# Patient Record
Sex: Male | Born: 1965 | Race: White | Hispanic: No | Marital: Married | Smoking: Former smoker
Health system: Southern US, Community
[De-identification: ages and names within clinical notes are randomized; demographics above are authoritative.]

## PROBLEM LIST (undated history)

## (undated) DIAGNOSIS — F329 Major depressive disorder, single episode, unspecified: Secondary | ICD-10-CM

## (undated) DIAGNOSIS — Z8719 Personal history of other diseases of the digestive system: Secondary | ICD-10-CM

## (undated) DIAGNOSIS — K5190 Ulcerative colitis, unspecified, without complications: Secondary | ICD-10-CM

## (undated) DIAGNOSIS — F419 Anxiety disorder, unspecified: Secondary | ICD-10-CM

## (undated) DIAGNOSIS — K5903 Drug induced constipation: Secondary | ICD-10-CM

## (undated) DIAGNOSIS — F32A Depression, unspecified: Secondary | ICD-10-CM

## (undated) DIAGNOSIS — Z9889 Other specified postprocedural states: Secondary | ICD-10-CM

## (undated) DIAGNOSIS — Z8669 Personal history of other diseases of the nervous system and sense organs: Secondary | ICD-10-CM

## (undated) DIAGNOSIS — G8929 Other chronic pain: Secondary | ICD-10-CM

## (undated) DIAGNOSIS — K219 Gastro-esophageal reflux disease without esophagitis: Secondary | ICD-10-CM

## (undated) DIAGNOSIS — R112 Nausea with vomiting, unspecified: Secondary | ICD-10-CM

## (undated) DIAGNOSIS — G43909 Migraine, unspecified, not intractable, without status migrainosus: Secondary | ICD-10-CM

## (undated) DIAGNOSIS — G4700 Insomnia, unspecified: Secondary | ICD-10-CM

## (undated) DIAGNOSIS — R42 Dizziness and giddiness: Secondary | ICD-10-CM

## (undated) DIAGNOSIS — R002 Palpitations: Secondary | ICD-10-CM

## (undated) DIAGNOSIS — K529 Noninfective gastroenteritis and colitis, unspecified: Secondary | ICD-10-CM

## (undated) DIAGNOSIS — J189 Pneumonia, unspecified organism: Secondary | ICD-10-CM

## (undated) DIAGNOSIS — Z8709 Personal history of other diseases of the respiratory system: Secondary | ICD-10-CM

## (undated) DIAGNOSIS — R309 Painful micturition, unspecified: Secondary | ICD-10-CM

## (undated) DIAGNOSIS — M549 Dorsalgia, unspecified: Secondary | ICD-10-CM

## (undated) DIAGNOSIS — J329 Chronic sinusitis, unspecified: Secondary | ICD-10-CM

## (undated) DIAGNOSIS — E785 Hyperlipidemia, unspecified: Secondary | ICD-10-CM

## (undated) DIAGNOSIS — J40 Bronchitis, not specified as acute or chronic: Secondary | ICD-10-CM

## (undated) HISTORY — PX: UMBILICAL HERNIA REPAIR: SHX196

## (undated) HISTORY — PX: FOREARM SURGERY: SHX651

## (undated) HISTORY — PX: OTHER SURGICAL HISTORY: SHX169

## (undated) HISTORY — PX: HERNIA REPAIR: SHX51

## (undated) HISTORY — PX: HAND SURGERY: SHX662

## (undated) HISTORY — PX: NASAL SINUS SURGERY: SHX719

## (undated) HISTORY — PX: KNEE SURGERY: SHX244

---

## 2004-05-18 ENCOUNTER — Emergency Department (HOSPITAL_COMMUNITY): Admission: EM | Admit: 2004-05-18 | Discharge: 2004-05-19 | Payer: Self-pay | Admitting: Emergency Medicine

## 2005-03-03 ENCOUNTER — Ambulatory Visit (HOSPITAL_COMMUNITY): Admission: RE | Admit: 2005-03-03 | Discharge: 2005-03-03 | Payer: Self-pay | Admitting: Orthopedic Surgery

## 2005-06-23 ENCOUNTER — Emergency Department (HOSPITAL_COMMUNITY): Admission: EM | Admit: 2005-06-23 | Discharge: 2005-06-23 | Payer: Self-pay | Admitting: Emergency Medicine

## 2006-07-03 ENCOUNTER — Emergency Department (HOSPITAL_COMMUNITY): Admission: EM | Admit: 2006-07-03 | Discharge: 2006-07-03 | Payer: Self-pay | Admitting: Emergency Medicine

## 2006-07-10 ENCOUNTER — Emergency Department (HOSPITAL_COMMUNITY): Admission: EM | Admit: 2006-07-10 | Discharge: 2006-07-11 | Payer: Self-pay | Admitting: Emergency Medicine

## 2007-04-19 ENCOUNTER — Emergency Department (HOSPITAL_COMMUNITY): Admission: EM | Admit: 2007-04-19 | Discharge: 2007-04-19 | Payer: Self-pay | Admitting: Emergency Medicine

## 2007-11-11 ENCOUNTER — Emergency Department (HOSPITAL_COMMUNITY): Admission: EM | Admit: 2007-11-11 | Discharge: 2007-11-11 | Payer: Self-pay | Admitting: Emergency Medicine

## 2008-05-11 ENCOUNTER — Emergency Department (HOSPITAL_COMMUNITY): Admission: EM | Admit: 2008-05-11 | Discharge: 2008-05-11 | Payer: Self-pay | Admitting: Emergency Medicine

## 2008-06-26 ENCOUNTER — Emergency Department (HOSPITAL_COMMUNITY): Admission: EM | Admit: 2008-06-26 | Discharge: 2008-06-27 | Payer: Self-pay | Admitting: Emergency Medicine

## 2008-06-27 ENCOUNTER — Emergency Department (HOSPITAL_COMMUNITY): Admission: EM | Admit: 2008-06-27 | Discharge: 2008-06-28 | Payer: Self-pay | Admitting: Emergency Medicine

## 2010-03-15 ENCOUNTER — Inpatient Hospital Stay (HOSPITAL_COMMUNITY): Admission: EM | Admit: 2010-03-15 | Discharge: 2010-03-18 | Payer: Self-pay | Admitting: Emergency Medicine

## 2010-09-02 LAB — BASIC METABOLIC PANEL
BUN: 9 mg/dL (ref 6–23)
CO2: 25 mEq/L (ref 19–32)
Calcium: 7.8 mg/dL — ABNORMAL LOW (ref 8.4–10.5)
Chloride: 107 mEq/L (ref 96–112)
Creatinine, Ser: 0.82 mg/dL (ref 0.4–1.5)
GFR calc Af Amer: >60 mL/min (ref >60)
GFR calc non Af Amer: >60 mL/min (ref >60)
Glucose, Bld: 103 mg/dL — ABNORMAL HIGH (ref 70–99)
Potassium: 3.3 mEq/L — ABNORMAL LOW (ref 3.5–5.1)
Sodium: 137 mEq/L (ref 135–145)

## 2010-09-02 LAB — CBC
HCT: 40.5 % (ref 39.0–52.0)
HCT: 41.4 % (ref 39.0–52.0)
HCT: 46.2 % (ref 39.0–52.0)
Hemoglobin: 13.8 g/dL (ref 13.0–17.0)
Hemoglobin: 14.1 g/dL (ref 13.0–17.0)
Hemoglobin: 16 g/dL (ref 13.0–17.0)
MCH: 30.6 pg (ref 26.0–34.0)
MCH: 31.1 pg (ref 26.0–34.0)
MCH: 31.2 pg (ref 26.0–34.0)
MCHC: 34 g/dL (ref 30.0–36.0)
MCHC: 34.1 g/dL (ref 30.0–36.0)
MCHC: 34.7 g/dL (ref 30.0–36.0)
MCV: 90 fL (ref 78.0–100.0)
MCV: 90.1 fL (ref 78.0–100.0)
MCV: 91 fL (ref 78.0–100.0)
Platelets: 180 10*3/uL (ref 150–400)
Platelets: 189 10*3/uL (ref 150–400)
Platelets: 189 10*3/uL (ref 150–400)
RBC: 4.5 MIL/uL (ref 4.22–5.81)
RBC: 4.55 MIL/uL (ref 4.22–5.81)
RBC: 5.13 MIL/uL (ref 4.22–5.81)
RDW: 13.2 % (ref 11.5–15.5)
RDW: 13.2 % (ref 11.5–15.5)
RDW: 13.2 % (ref 11.5–15.5)
WBC: 6.9 10*3/uL (ref 4.0–10.5)
WBC: 7.1 10*3/uL (ref 4.0–10.5)
WBC: 8.3 10*3/uL (ref 4.0–10.5)

## 2010-09-02 LAB — COMPREHENSIVE METABOLIC PANEL
ALT: 21 U/L (ref 0–53)
ALT: 30 U/L (ref 0–53)
AST: 17 U/L (ref 0–37)
AST: 19 U/L (ref 0–37)
Albumin: 3.5 g/dL (ref 3.5–5.2)
Albumin: 4.1 g/dL (ref 3.5–5.2)
Alkaline Phosphatase: 42 U/L (ref 39–117)
Alkaline Phosphatase: 46 U/L (ref 39–117)
BUN: 12 mg/dL (ref 6–23)
BUN: 5 mg/dL — ABNORMAL LOW (ref 6–23)
CO2: 27 mEq/L (ref 19–32)
CO2: 29 mEq/L (ref 19–32)
Calcium: 8.3 mg/dL — ABNORMAL LOW (ref 8.4–10.5)
Calcium: 8.9 mg/dL (ref 8.4–10.5)
Chloride: 105 mEq/L (ref 96–112)
Chloride: 106 mEq/L (ref 96–112)
Creatinine, Ser: 0.98 mg/dL (ref 0.4–1.5)
Creatinine, Ser: 1.01 mg/dL (ref 0.4–1.5)
GFR calc Af Amer: >60 mL/min (ref >60)
GFR calc Af Amer: >60 mL/min (ref >60)
GFR calc non Af Amer: >60 mL/min (ref >60)
GFR calc non Af Amer: >60 mL/min (ref >60)
Glucose, Bld: 126 mg/dL — ABNORMAL HIGH (ref 70–99)
Glucose, Bld: 90 mg/dL (ref 70–99)
Potassium: 3.8 mEq/L (ref 3.5–5.1)
Potassium: 3.8 mEq/L (ref 3.5–5.1)
Sodium: 139 mEq/L (ref 135–145)
Sodium: 140 mEq/L (ref 135–145)
Total Bilirubin: 0.8 mg/dL (ref 0.3–1.2)
Total Bilirubin: 0.9 mg/dL (ref 0.3–1.2)
Total Protein: 6.1 g/dL (ref 6.0–8.3)
Total Protein: 7.1 g/dL (ref 6.0–8.3)

## 2010-09-02 LAB — DIFFERENTIAL
Basophils Absolute: 0 10*3/uL (ref 0.0–0.1)
Basophils Absolute: 0 10*3/uL (ref 0.0–0.1)
Basophils Relative: 0 % (ref 0–1)
Eosinophils Absolute: 0.3 10*3/uL (ref 0.0–0.7)
Eosinophils Relative: 3 % (ref 0–5)
Eosinophils Relative: 5 % (ref 0–5)
Lymphocytes Relative: 26 % (ref 12–46)
Lymphocytes Relative: 38 % (ref 12–46)
Lymphs Abs: 2.1 10*3/uL (ref 0.7–4.0)
Lymphs Abs: 2.7 10*3/uL (ref 0.7–4.0)
Monocytes Absolute: 0.5 10*3/uL (ref 0.1–1.0)
Monocytes Absolute: 0.5 10*3/uL (ref 0.1–1.0)
Monocytes Relative: 6 % (ref 3–12)
Monocytes Relative: 7 % (ref 3–12)
Neutro Abs: 3.6 10*3/uL (ref 1.7–7.7)
Neutro Abs: 5.4 10*3/uL (ref 1.7–7.7)
Neutrophils Relative %: 65 % (ref 43–77)

## 2010-09-02 LAB — PROTIME-INR
INR: 0.99 (ref 0.00–1.49)
Prothrombin Time: 13.3 seconds (ref 11.6–15.2)

## 2010-09-02 LAB — PHOSPHORUS: Phosphorus: 3.5 mg/dL (ref 2.3–4.6)

## 2010-09-02 LAB — SAMPLE TO BLOOD BANK

## 2010-09-02 LAB — APTT: aPTT: 29 seconds (ref 24–37)

## 2010-09-02 LAB — C-REACTIVE PROTEIN: CRP: 0.1 mg/dL — ABNORMAL LOW (ref <0.6)

## 2010-10-04 LAB — COMPREHENSIVE METABOLIC PANEL
ALT: 118 U/L — ABNORMAL HIGH (ref 0–53)
AST: 72 U/L — ABNORMAL HIGH (ref 0–37)
Albumin: 3.4 g/dL — ABNORMAL LOW (ref 3.5–5.2)
Calcium: 8.4 mg/dL (ref 8.4–10.5)
Chloride: 101 mEq/L (ref 96–112)
Creatinine, Ser: 1.04 mg/dL (ref 0.4–1.5)
GFR calc Af Amer: >60 mL/min (ref >60)
Sodium: 135 mEq/L (ref 135–145)

## 2010-10-04 LAB — URINE MICROSCOPIC-ADD ON

## 2010-10-04 LAB — DIFFERENTIAL
Eosinophils Relative: 0 % (ref 0–5)
Lymphocytes Relative: 8 % — ABNORMAL LOW (ref 12–46)
Lymphs Abs: 1.1 10*3/uL (ref 0.7–4.0)
Monocytes Absolute: 0.8 10*3/uL (ref 0.1–1.0)

## 2010-10-04 LAB — CBC
MCHC: 34 g/dL (ref 30.0–36.0)
MCV: 87.5 fL (ref 78.0–100.0)
Platelets: 164 10*3/uL (ref 150–400)
WBC: 13.3 10*3/uL — ABNORMAL HIGH (ref 4.0–10.5)

## 2010-10-04 LAB — URINALYSIS, ROUTINE W REFLEX MICROSCOPIC
Glucose, UA: NEGATIVE mg/dL
Ketones, ur: 15 mg/dL — AB
Leukocytes, UA: NEGATIVE
Nitrite: NEGATIVE
Specific Gravity, Urine: 1.025 (ref 1.005–1.030)
pH: 6 (ref 5.0–8.0)

## 2010-10-25 ENCOUNTER — Emergency Department (HOSPITAL_COMMUNITY)
Admission: EM | Admit: 2010-10-25 | Discharge: 2010-10-25 | Disposition: A | Discharge status: Home / Self Care | Payer: Worker's Compensation | Attending: Emergency Medicine | Admitting: Emergency Medicine

## 2010-10-25 DIAGNOSIS — M545 Low back pain, unspecified: Secondary | ICD-10-CM | POA: Insufficient documentation

## 2010-10-25 DIAGNOSIS — M549 Dorsalgia, unspecified: Secondary | ICD-10-CM | POA: Insufficient documentation

## 2010-10-25 DIAGNOSIS — M5380 Other specified dorsopathies, site unspecified: Secondary | ICD-10-CM | POA: Insufficient documentation

## 2011-04-09 ENCOUNTER — Emergency Department (HOSPITAL_COMMUNITY)
Admission: EM | Admit: 2011-04-09 | Discharge: 2011-04-10 | Disposition: A | Discharge status: Home / Self Care | Payer: Worker's Compensation | Attending: Emergency Medicine | Admitting: Emergency Medicine

## 2011-04-09 DIAGNOSIS — R109 Unspecified abdominal pain: Secondary | ICD-10-CM | POA: Insufficient documentation

## 2011-04-09 DIAGNOSIS — Z7982 Long term (current) use of aspirin: Secondary | ICD-10-CM | POA: Insufficient documentation

## 2011-04-09 DIAGNOSIS — T4275XA Adverse effect of unspecified antiepileptic and sedative-hypnotic drugs, initial encounter: Secondary | ICD-10-CM | POA: Insufficient documentation

## 2011-04-09 DIAGNOSIS — G43909 Migraine, unspecified, not intractable, without status migrainosus: Secondary | ICD-10-CM | POA: Insufficient documentation

## 2011-04-09 DIAGNOSIS — Z79899 Other long term (current) drug therapy: Secondary | ICD-10-CM | POA: Insufficient documentation

## 2011-04-10 ENCOUNTER — Emergency Department (HOSPITAL_COMMUNITY): Payer: Worker's Compensation

## 2011-04-10 LAB — CBC
HCT: 44.4 % (ref 39.0–52.0)
MCH: 30 pg (ref 26.0–34.0)
MCHC: 35.1 g/dL (ref 30.0–36.0)
MCV: 85.4 fL (ref 78.0–100.0)
RDW: 12.2 % (ref 11.5–15.5)

## 2011-04-10 LAB — COMPREHENSIVE METABOLIC PANEL
Albumin: 3.8 g/dL (ref 3.5–5.2)
BUN: 23 mg/dL (ref 6–23)
Chloride: 104 mEq/L (ref 96–112)
Creatinine, Ser: 1.07 mg/dL (ref 0.50–1.35)
Total Bilirubin: 0.1 mg/dL — ABNORMAL LOW (ref 0.3–1.2)
Total Protein: 6.9 g/dL (ref 6.0–8.3)

## 2011-04-10 LAB — DIFFERENTIAL
Eosinophils Relative: 4 % (ref 0–5)
Lymphocytes Relative: 41 % (ref 12–46)
Lymphs Abs: 3.1 10*3/uL (ref 0.7–4.0)
Monocytes Absolute: 0.7 10*3/uL (ref 0.1–1.0)
Monocytes Relative: 9 % (ref 3–12)

## 2011-04-28 ENCOUNTER — Emergency Department (HOSPITAL_BASED_OUTPATIENT_CLINIC_OR_DEPARTMENT_OTHER)
Admission: EM | Admit: 2011-04-28 | Discharge: 2011-04-28 | Disposition: A | Discharge status: Home / Self Care | Payer: Worker's Compensation | Attending: Emergency Medicine | Admitting: Emergency Medicine

## 2011-04-28 ENCOUNTER — Encounter: Payer: Self-pay | Admitting: *Deleted

## 2011-04-28 ENCOUNTER — Emergency Department (INDEPENDENT_AMBULATORY_CARE_PROVIDER_SITE_OTHER): Payer: Worker's Compensation

## 2011-04-28 DIAGNOSIS — K5900 Constipation, unspecified: Secondary | ICD-10-CM | POA: Insufficient documentation

## 2011-04-28 DIAGNOSIS — R109 Unspecified abdominal pain: Secondary | ICD-10-CM

## 2011-04-28 DIAGNOSIS — R110 Nausea: Secondary | ICD-10-CM | POA: Insufficient documentation

## 2011-04-28 HISTORY — DX: Ulcerative colitis, unspecified, without complications: K51.90

## 2011-04-28 LAB — URINE MICROSCOPIC-ADD ON

## 2011-04-28 LAB — COMPREHENSIVE METABOLIC PANEL
ALT: 48 U/L (ref 0–53)
Alkaline Phosphatase: 77 U/L (ref 39–117)
CO2: 28 mEq/L (ref 19–32)
Chloride: 104 mEq/L (ref 96–112)
GFR calc Af Amer: >90 mL/min (ref >90)
GFR calc non Af Amer: >90 mL/min (ref >90)
Glucose, Bld: 97 mg/dL (ref 70–99)
Potassium: 4.1 mEq/L (ref 3.5–5.1)
Sodium: 140 mEq/L (ref 135–145)
Total Bilirubin: 0.4 mg/dL (ref 0.3–1.2)

## 2011-04-28 LAB — URINALYSIS, ROUTINE W REFLEX MICROSCOPIC
Hgb urine dipstick: NEGATIVE
Protein, ur: NEGATIVE mg/dL
Urobilinogen, UA: 1 mg/dL (ref 0.0–1.0)

## 2011-04-28 LAB — CBC
MCV: 83.5 fL (ref 78.0–100.0)
Platelets: 175 10*3/uL (ref 150–400)
RBC: 5.2 MIL/uL (ref 4.22–5.81)
WBC: 12.6 10*3/uL — ABNORMAL HIGH (ref 4.0–10.5)

## 2011-04-28 LAB — DIFFERENTIAL
Eosinophils Relative: 1 % (ref 0–5)
Lymphocytes Relative: 11 % — ABNORMAL LOW (ref 12–46)
Lymphs Abs: 1.4 10*3/uL (ref 0.7–4.0)

## 2011-04-28 LAB — OCCULT BLOOD X 1 CARD TO LAB, STOOL: Fecal Occult Bld: POSITIVE

## 2011-04-28 MED ORDER — SODIUM CHLORIDE 0.9 % IV SOLN
Freq: Once | INTRAVENOUS | Status: AC
  Administered 2011-04-28: 20:00:00 via INTRAVENOUS

## 2011-04-28 MED ORDER — POLYETHYLENE GLYCOL 3350 17 G PO PACK: 17.0000 g | PACK | Freq: Every day | ORAL | Status: DC

## 2011-04-28 MED ORDER — DIPHENHYDRAMINE HCL 50 MG/ML IJ SOLN
12.5000 mg | Freq: Once | INTRAMUSCULAR | Status: AC
  Administered 2011-04-28: 12.5 mg via INTRAVENOUS
  Filled 2011-04-28: qty 1

## 2011-04-28 MED ORDER — HYDROMORPHONE HCL PF 1 MG/ML IJ SOLN
1.0000 mg | Freq: Once | INTRAMUSCULAR | Status: AC
  Administered 2011-04-28: 1 mg via INTRAVENOUS
  Filled 2011-04-28: qty 1

## 2011-04-28 MED ORDER — METOCLOPRAMIDE HCL 5 MG/ML IJ SOLN
10.0000 mg | Freq: Once | INTRAMUSCULAR | Status: AC
  Administered 2011-04-28: 10 mg via INTRAVENOUS
  Filled 2011-04-28: qty 2

## 2011-04-28 MED ORDER — SODIUM CHLORIDE 0.9 % IV SOLN
2.0000 mg/h | INTRAVENOUS | Status: DC
  Filled 2011-04-28: qty 5

## 2011-04-28 MED ORDER — CIPROFLOXACIN HCL 500 MG PO TABS: 500.0000 mg | ORAL_TABLET | Freq: Two times a day (BID) | ORAL | Status: AC

## 2011-04-28 MED ORDER — IOHEXOL 300 MG/ML  SOLN
100.0000 mL | Freq: Once | INTRAMUSCULAR | Status: AC | PRN
  Administered 2011-04-28: 100 mL via INTRAVENOUS

## 2011-04-28 MED ORDER — ONDANSETRON HCL 4 MG/2ML IJ SOLN
4.0000 mg | Freq: Once | INTRAMUSCULAR | Status: AC
  Administered 2011-04-28: 4 mg via INTRAVENOUS
  Filled 2011-04-28: qty 2

## 2011-04-28 NOTE — ED Notes (Signed)
Pt to room 6 by ems, ems reports pt takes several narcotics for chronic pain and has been unable to have a bm today, last normal bm last night. ems reports pt sitting on toilet on their arrival c/o cramping and inability to deficate. Per pt request pt given bsc and walks from stretcher to bsc to attempt to deficate. Family at bedside.

## 2011-04-28 NOTE — ED Notes (Signed)
Patient came by EMS, before he was on bed from stretcher, he was demanding to have bowel movement. We moved patient to bedside toilet where his family gathered in and outside room. Patient excused himself from room and made haste to bathroom against staff advise. Patient stayed in hall bath. I was finally able to take initial vitals and then I helped patient into gown and brought warm blankets. Patient resting quietly. Patient was able to pass hard stool in room and stated larger amount of stool passed in bathroom. Patient stated he is taking pain medication.

## 2011-04-28 NOTE — ED Notes (Signed)
Family requesting to speak with Clydie Braun, [place] before discharge, PA made aware of request.

## 2011-04-28 NOTE — ED Provider Notes (Signed)
History     CSN: [id number] Arrival date & time: 04/28/2011  2:05 PM   First MD Initiated Contact with Patient 04/28/11 1417      Chief Complaint  Patient presents with  . Constipation    (Consider location/radiation/quality/duration/timing/severity/associated sxs/prior treatment) Patient is a 45 y.o. male presenting with abdominal pain. The history is provided by the patient. No language interpreter was used.  Abdominal Pain The primary symptoms of the illness include abdominal pain. The current episode started 13 to 24 hours ago. The onset of the illness was sudden. The problem has not changed since onset. Associated with: narcotics. The patient states that she believes she is currently not pregnant. Additional symptoms associated with the illness include chills and constipation.  Pt complains of cramping and nausea.   Pt reports he began feeling very nauseated at home and felt like he was going to pass out.  Pt's wife reports she recently had a diarrhea type illness.  Pt complains of feeling like he needs to have a bowel movement.  Pt reports he passes small hard stool frequently.  Past Medical History  Diagnosis Date  . Colitis, ulcerative   . Chronic pain     History reviewed. No pertinent past surgical history.  History reviewed. No pertinent family history.  History  Substance Use Topics  . Smoking status: Former Games developer  . Smokeless tobacco: Not on file  . Alcohol Use: Yes      Review of Systems  Constitutional: Positive for chills.  Gastrointestinal: Positive for abdominal pain and constipation.  All other systems reviewed and are negative.    Allergies  Nsaids  Home Medications   Current Outpatient Rx  Name Route Sig Dispense Refill  . HYDROCODONE-ACETAMINOPHEN 7.5-400 MG PO TABS Oral Take 1 tablet by mouth every 6 (six) hours as needed.      Marland Kitchen HYDROMORPHONE HCL 12 MG PO TB24 Oral Take by mouth daily.      Marland Kitchen OXYMORPHONE HCL 10 MG PO TABS Oral Take 10 mg  by mouth 2 (two) times daily.      Marland Kitchen PREGABALIN 225 MG PO CAPS Oral Take 225 mg by mouth 2 (two) times daily.        There were no vitals taken for this visit.  Physical Exam  Nursing note and vitals reviewed. Constitutional: He is oriented to person, place, and time. He appears well-developed and well-nourished.  HENT:  Head: Normocephalic and atraumatic.  Right Ear: External ear normal.  Left Ear: External ear normal.  Nose: Nose normal.  Mouth/Throat: Oropharynx is clear and moist.  Eyes: Conjunctivae and EOM are normal. Pupils are equal, round, and reactive to light.  Neck: Normal range of motion.  Cardiovascular: Normal rate and normal heart sounds.   Pulmonary/Chest: Effort normal.  Abdominal: There is tenderness.  Musculoskeletal: Normal range of motion.  Neurological: He is alert and oriented to person, place, and time.  Skin: Skin is warm.  Psychiatric: He has a normal mood and affect.  rectal exam  No stool in vault,     ED Course  Procedures (including critical care time)  Labs Reviewed - No data to display No results found.   No diagnosis found.    MDM  Ct scan shows scarring,  No acute abnormality,   Pt [place] followed by regional physicians.  Pt reports his MD has been trying to change his pain medications.  Pt is currently on opana.   I reviewed ct pt does have a large stool  burden.  I will start miralax.  Pt advised fleets enema, repeat in 1 hour if no relief and again in 6 hours.  Pt advised to see his MD for recheck tomorrow,  Mag citrate        Langston Masker, [place] 04/28/11 1901  Langston Masker, [place] 04/29/11 1950

## 2011-04-28 NOTE — ED Notes (Signed)
IV from right [facility] removed per order, cath intact.

## 2011-04-30 ENCOUNTER — Emergency Department (HOSPITAL_BASED_OUTPATIENT_CLINIC_OR_DEPARTMENT_OTHER)
Admission: EM | Admit: 2011-04-30 | Discharge: 2011-04-30 | Disposition: A | Discharge status: Home / Self Care | Payer: Worker's Compensation | Attending: Emergency Medicine | Admitting: Emergency Medicine

## 2011-04-30 ENCOUNTER — Emergency Department (INDEPENDENT_AMBULATORY_CARE_PROVIDER_SITE_OTHER): Payer: Worker's Compensation

## 2011-04-30 ENCOUNTER — Encounter (HOSPITAL_BASED_OUTPATIENT_CLINIC_OR_DEPARTMENT_OTHER): Payer: Self-pay

## 2011-04-30 DIAGNOSIS — G8929 Other chronic pain: Secondary | ICD-10-CM

## 2011-04-30 DIAGNOSIS — R112 Nausea with vomiting, unspecified: Secondary | ICD-10-CM | POA: Insufficient documentation

## 2011-04-30 DIAGNOSIS — K625 Hemorrhage of anus and rectum: Secondary | ICD-10-CM

## 2011-04-30 DIAGNOSIS — Z79899 Other long term (current) drug therapy: Secondary | ICD-10-CM | POA: Insufficient documentation

## 2011-04-30 DIAGNOSIS — R109 Unspecified abdominal pain: Secondary | ICD-10-CM | POA: Insufficient documentation

## 2011-04-30 HISTORY — DX: Other chronic pain: G89.29

## 2011-04-30 HISTORY — DX: Dorsalgia, unspecified: M54.9

## 2011-04-30 LAB — COMPREHENSIVE METABOLIC PANEL
ALT: 32 U/L (ref 0–53)
Alkaline Phosphatase: 73 U/L (ref 39–117)
BUN: 11 mg/dL (ref 6–23)
CO2: 23 mEq/L (ref 19–32)
Calcium: 10 mg/dL (ref 8.4–10.5)
GFR calc Af Amer: >90 mL/min (ref >90)
GFR calc non Af Amer: >90 mL/min (ref >90)
Glucose, Bld: 122 mg/dL — ABNORMAL HIGH (ref 70–99)
Potassium: 3.5 mEq/L (ref 3.5–5.1)
Sodium: 139 mEq/L (ref 135–145)
Total Protein: 7.4 g/dL (ref 6.0–8.3)

## 2011-04-30 LAB — CBC
Hemoglobin: 15.5 g/dL (ref 13.0–17.0)
MCH: 29.1 pg (ref 26.0–34.0)
MCV: 83.1 fL (ref 78.0–100.0)
Platelets: 198 10*3/uL (ref 150–400)
RBC: 5.32 MIL/uL (ref 4.22–5.81)
WBC: 16.9 10*3/uL — ABNORMAL HIGH (ref 4.0–10.5)

## 2011-04-30 LAB — URINALYSIS, ROUTINE W REFLEX MICROSCOPIC
Ketones, ur: >80 mg/dL — AB
Leukocytes, UA: NEGATIVE
Nitrite: NEGATIVE
Protein, ur: 30 mg/dL — AB
Urobilinogen, UA: 1 mg/dL (ref 0.0–1.0)

## 2011-04-30 LAB — DIFFERENTIAL
Eosinophils Absolute: 0 10*3/uL (ref 0.0–0.7)
Eosinophils Relative: 0 % (ref 0–5)
Lymphocytes Relative: 10 % — ABNORMAL LOW (ref 12–46)
Lymphs Abs: 1.7 10*3/uL (ref 0.7–4.0)
Monocytes Relative: 7 % (ref 3–12)

## 2011-04-30 MED ORDER — ONDANSETRON HCL 4 MG/2ML IJ SOLN: 4.0000 mg | Freq: Once | INTRAMUSCULAR | Status: DC

## 2011-04-30 MED ORDER — ONDANSETRON 8 MG PO TBDP: 8.0000 mg | ORAL_TABLET | Freq: Three times a day (TID) | ORAL | Status: DC | PRN

## 2011-04-30 MED ORDER — FENTANYL CITRATE 0.05 MG/ML IJ SOLN
50.0000 ug | INTRAMUSCULAR | Status: AC
  Administered 2011-04-30: 50 ug via INTRAVENOUS
  Filled 2011-04-30: qty 2

## 2011-04-30 MED ORDER — HYDROMORPHONE HCL PF 1 MG/ML IJ SOLN
1.0000 mg | Freq: Once | INTRAMUSCULAR | Status: AC
  Administered 2011-04-30: 1 mg via INTRAVENOUS

## 2011-04-30 MED ORDER — HYDROMORPHONE HCL PF 2 MG/ML IJ SOLN
2.0000 mg | Freq: Once | INTRAMUSCULAR | Status: DC
  Filled 2011-04-30: qty 1

## 2011-04-30 MED ORDER — ONDANSETRON HCL 4 MG/2ML IJ SOLN
INTRAMUSCULAR | Status: AC
  Administered 2011-04-29: 4 mg via INTRAVENOUS
  Filled 2011-04-30: qty 2

## 2011-04-30 NOTE — ED Provider Notes (Signed)
Medical screening examination/treatment/procedure(s) were performed by non-physician practitioner and as supervising physician I was immediately available for consultation/collaboration.   Glynn Octave, MD 04/30/11 1049

## 2011-04-30 NOTE — ED Notes (Signed)
Pt removed pulse oximetry and blood pressure cuff and ambulated to the restroom with the assistance of his son without difficulty.

## 2011-04-30 NOTE — ED Notes (Signed)
Per [facility] pt was seen here yesterday, c/o abdominal pain and rectal bleeding for past 2 days, iv started per [facility] and 4mg  Zofran given.

## 2011-04-30 NOTE — ED Provider Notes (Signed)
History     CSN: [id number] Arrival date & time: 04/30/2011 12:05 AM   First MD Initiated Contact with Patient 04/30/11 0113      Chief Complaint  Patient presents with  . Abdominal Pain  . Rectal Bleeding    x2 days    (Consider location/radiation/quality/duration/timing/severity/associated sxs/prior treatment) Patient is a 45 y.o. male presenting with abdominal pain and hematochezia. The history is provided by the patient.  Abdominal Pain The primary symptoms of the illness include abdominal pain, nausea, vomiting, diarrhea and hematochezia. The primary symptoms of the illness do not include fever, fatigue, shortness of breath, hematemesis or dysuria. The current episode started more than 2 days ago. The onset of the illness was sudden. The problem has been gradually worsening.  The abdominal pain began more than 2 days ago. The pain came on suddenly. The abdominal pain has been unchanged since its onset. The abdominal pain is generalized. The abdominal pain does not radiate. The severity of the abdominal pain is 10/10. The abdominal pain is relieved by nothing.  Nausea began 2 days ago.  The vomiting began 2 days ago. Vomiting occurs 2 to 5 times per day. The emesis contains stomach contents.  The diarrhea began 2 days ago. The diarrhea is blood-tinged and semi-solid. The diarrhea occurs 2 to 4 times per day (More like loose bowel movement.).  Additional symptoms associated with the illness include back pain. Symptoms associated with the illness do not include hematuria.  Rectal Bleeding  Associated symptoms include abdominal pain, diarrhea, nausea and vomiting. Pertinent negatives include no fever, no hematemesis, no hematuria, no chest pain, no headaches, no coughing and no rash.   Patient seen at this facility on November 8 for the same complaint had CT scan at that time. Patient is followed by pain management. Patient has a history of chronic abdominal pain. Also has a history of  chronic pain to the back. The abdominal pain today has exacerbated his back pain. Occasional vomiting no blood in it. Approximately 2 loose bowel movements per day with some blood streaking.   Past Medical History  Diagnosis Date  . Colitis, ulcerative   . Chronic pain   . Chronic back pain     Past Surgical History  Procedure Date  . Back surgery     History reviewed. No pertinent family history.  History  Substance Use Topics  . Smoking status: Former Games developer  . Smokeless tobacco: Not on file  . Alcohol Use: Yes      Review of Systems  Constitutional: Negative for fever and fatigue.  HENT: Negative for congestion and neck pain.   Eyes: Negative for redness.  Respiratory: Negative for cough and shortness of breath.   Cardiovascular: Negative for chest pain.  Gastrointestinal: Positive for nausea, vomiting, abdominal pain, diarrhea and hematochezia. Negative for hematemesis.  Genitourinary: Negative for dysuria and hematuria.  Musculoskeletal: Positive for back pain.  Skin: Negative for rash.  Neurological: Negative for headaches.  Hematological: Negative for adenopathy.    Allergies  Nsaids and Versed  Home Medications   Current Outpatient Rx  Name Route Sig Dispense Refill  . ZOFRAN IV Intravenous Inject 4 mg into the vein once. Given by [facility] to [facility]      . BUTALBITAL-APAP-CAFFEINE 50-325-40 MG PO TABS Oral Take 1 tablet by mouth 2 (two) times daily as needed. For migraine     . CIPROFLOXACIN HCL 500 MG PO TABS Oral Take 1 tablet (500 mg total) by mouth 2 (two)  times daily. 20 tablet 0  . METHOCARBAMOL 750 MG PO TABS Oral Take 750 mg by mouth 2 (two) times daily as needed. For muscle spasms     . ONDANSETRON 8 MG PO TBDP Oral Take 1 tablet (8 mg total) by mouth every 8 (eight) hours as needed for nausea. 10 tablet 0  . OXYMORPHONE HCL (CRUSH RESIST) 10 MG PO TB12 Oral Take 10 mg by mouth 2 (two) times daily.      Marland Kitchen OXYMORPHONE HCL 5 MG PO TABS Oral Take  5-10 mg by mouth every 4 (four) hours as needed. For breakthrough pain     . POLYETHYLENE GLYCOL 3350 PO PACK Oral Take 17 g by mouth daily. 14 each 0  . PREGABALIN 225 MG PO CAPS Oral Take 225 mg by mouth 2 (two) times daily.      Marland Kitchen RABEPRAZOLE SODIUM 20 MG PO TBEC Oral Take 20 mg by mouth daily as needed. For acid reflux        BP 133/86  Pulse 78  Temp 97.9 F (36.6 C)  Resp 20  Ht 5\' 11"  (1.803 m)  Wt 210 lb (95.255 kg)  BMI 29.29 kg/m2  SpO2 99%  Physical Exam  Nursing note and vitals reviewed. Constitutional: He is oriented to person, place, and time. He appears well-developed and well-nourished.  HENT:  Head: Normocephalic and atraumatic.  Mouth/Throat: Oropharynx is clear and moist.  Eyes: Conjunctivae and EOM are normal. Pupils are equal, round, and reactive to light.  Neck: Normal range of motion. Neck supple.  Cardiovascular: Normal rate, regular rhythm, normal heart sounds and intact distal pulses.   No murmur heard. Pulmonary/Chest: Effort normal and breath sounds normal.  Abdominal: Soft. Bowel sounds are normal. He exhibits no mass. There is no tenderness.  Musculoskeletal: Normal range of motion. He exhibits no edema.       No lumbar or paraspinous tenderness.  Neurological: He is alert and oriented to person, place, and time. No cranial nerve deficit. He exhibits normal muscle tone. Coordination normal.  Skin: Skin is warm and dry. No rash noted. No erythema.    ED Course  Procedures (including critical care time)  Labs Reviewed  URINALYSIS, ROUTINE W REFLEX MICROSCOPIC - Abnormal; Notable for the following:    Color, Urine AMBER (*) BIOCHEMICALS MAY BE AFFECTED BY COLOR   Bilirubin Urine SMALL (*)    Ketones, ur >80 (*)    Protein, ur 30 (*)    All other components within normal limits  URINE MICROSCOPIC-ADD ON - Abnormal; Notable for the following:    Bacteria, UA FEW (*)    All other components within normal limits  CBC - Abnormal; Notable for the  following:    WBC 16.9 (*)    All other components within normal limits  DIFFERENTIAL - Abnormal; Notable for the following:    Neutrophils Relative 83 (*)    Neutro Abs 14.0 (*)    Lymphocytes Relative 10 (*)    Monocytes Absolute 1.2 (*)    All other components within normal limits  COMPREHENSIVE METABOLIC PANEL - Abnormal; Notable for the following:    Glucose, Bld 122 (*)    All other components within normal limits   Ct Abdomen Pelvis W Contrast  04/28/2011  *RADIOLOGY REPORT*  Clinical Data: 45 year old with abdominal pain. History of ulcerative colitis.  CT ABDOMEN AND PELVIS WITH CONTRAST  Technique:  Multidetector CT imaging of the abdomen and pelvis was performed following the standard protocol during bolus  administration of intravenous contrast.  Contrast: OMNIPAQUE IOHEXOL 300 MG/ML IV SOLN  Comparison: 03/15/2010  Findings: The lung bases are clear.  There is no evidence for free air.  Normal appearance of the liver, portal venous system and gallbladder.  Normal appearance of the spleen, pancreas, adrenal glands and kidneys.  Normal appearance of the stomach and duodenum.  There is a stable oval shaped fatty density along the right side of the lower sigmoid colon, probably representing a granuloma and old inflammation.  No gross abnormality to the prostate, seminal vesicles and urinary bladder.  No evidence for free fluid or lymphadenopathy.  No acute bone abnormalities.  IMPRESSION: No acute abdominal or pelvic findings.  There is a stable low density structure along the right side of the distal sigmoid colon.  This finding likely represent old inflammation and granulomatous formation.  No evidence for acute inflammation in this area.  Original Report Authenticated By: Richarda Overlie, M.D.   Dg Abd Acute W/chest  04/30/2011  *RADIOLOGY REPORT*  Clinical Data: Abdominal pain and rectal bleeding; history of ulcerative colitis.  ACUTE ABDOMEN SERIES (ABDOMEN 2 VIEW & CHEST 1 VIEW)   Comparison: CT of the abdomen and pelvis performed 04/28/2011, and chest radiograph performed 06/27/2008  Findings: The lungs are well-aerated and clear.  There is no evidence of focal opacification, pleural effusion or pneumothorax. The cardiomediastinal silhouette is within normal limits.  The visualized bowel gas pattern is unremarkable.  Scattered air and fluid are noted within the colon; there is no evidence of small bowel dilatation to suggest obstruction.  No free intra-abdominal air is identified on the provided upright view.  No acute osseous abnormalities are seen; the sacroiliac joints are unremarkable in appearance.  IMPRESSION:  1.  Unremarkable bowel gas pattern; no free intra-abdominal air seen. 2.  No acute cardiopulmonary process identified.  Original Report Authenticated By: Tonia Ghent, M.D.     1. Chronic abdominal pain       MDM   Patient with history of chronic pain and chronic abdominal pain also history of ulcerative colitis. Patient just seen on November 8 for the same complaint at this facility. Patient had CT scan at that time those results for review and noted as above. ED chart from that visit also reviewed. Patient is followed by pain management specialist. Patient given IM dialogue 2 mg here for the pain. Workup negative except for new elevation in the white blood cell count to 16,000. CT was not repeated since it was just done on the eighth. Acute abdominal series was unremarkable. Suspect of patient's chronic abdominal pain and not in acute exacerbation of ulcerative colitis.        Shelda Jakes, MD 04/30/11 [phone]

## 2011-05-02 ENCOUNTER — Encounter (HOSPITAL_COMMUNITY): Payer: Self-pay | Admitting: *Deleted

## 2011-05-02 ENCOUNTER — Emergency Department (HOSPITAL_COMMUNITY)
Admission: EM | Admit: 2011-05-02 | Discharge: 2011-05-02 | Disposition: A | Discharge status: Home / Self Care | Payer: Worker's Compensation | Attending: Emergency Medicine | Admitting: Emergency Medicine

## 2011-05-02 DIAGNOSIS — Z9889 Other specified postprocedural states: Secondary | ICD-10-CM | POA: Insufficient documentation

## 2011-05-02 DIAGNOSIS — R10816 Epigastric abdominal tenderness: Secondary | ICD-10-CM | POA: Insufficient documentation

## 2011-05-02 DIAGNOSIS — Z79899 Other long term (current) drug therapy: Secondary | ICD-10-CM | POA: Insufficient documentation

## 2011-05-02 DIAGNOSIS — M549 Dorsalgia, unspecified: Secondary | ICD-10-CM | POA: Insufficient documentation

## 2011-05-02 DIAGNOSIS — R1013 Epigastric pain: Secondary | ICD-10-CM | POA: Insufficient documentation

## 2011-05-02 HISTORY — DX: Personal history of other diseases of the respiratory system: Z87.09

## 2011-05-02 HISTORY — DX: Personal history of other diseases of the nervous system and sense organs: Z86.69

## 2011-05-02 HISTORY — DX: Noninfective gastroenteritis and colitis, unspecified: K52.9

## 2011-05-02 HISTORY — DX: Personal history of other diseases of the digestive system: Z87.19

## 2011-05-02 HISTORY — DX: Chronic sinusitis, unspecified: J32.9

## 2011-05-02 LAB — CBC
HCT: 42.2 % (ref 39.0–52.0)
MCHC: 35.5 g/dL (ref 30.0–36.0)
RDW: 12.4 % (ref 11.5–15.5)
WBC: 8.8 10*3/uL (ref 4.0–10.5)

## 2011-05-02 LAB — DIFFERENTIAL
Basophils Absolute: 0 10*3/uL (ref 0.0–0.1)
Basophils Relative: 0 % (ref 0–1)
Lymphocytes Relative: 27 % (ref 12–46)
Neutro Abs: 5.5 10*3/uL (ref 1.7–7.7)
Neutrophils Relative %: 62 % (ref 43–77)

## 2011-05-02 LAB — COMPREHENSIVE METABOLIC PANEL
ALT: 28 U/L (ref 0–53)
AST: 16 U/L (ref 0–37)
Albumin: 3.6 g/dL (ref 3.5–5.2)
Alkaline Phosphatase: 66 U/L (ref 39–117)
CO2: 28 mEq/L (ref 19–32)
Chloride: 102 mEq/L (ref 96–112)
Creatinine, Ser: 1.05 mg/dL (ref 0.50–1.35)
GFR calc non Af Amer: 85 mL/min — ABNORMAL LOW (ref >90)
Potassium: 3.8 mEq/L (ref 3.5–5.1)
Sodium: 138 mEq/L (ref 135–145)
Total Bilirubin: 0.2 mg/dL — ABNORMAL LOW (ref 0.3–1.2)

## 2011-05-02 LAB — LACTIC ACID, PLASMA: Lactic Acid, Venous: 0.8 mmol/L (ref 0.5–2.2)

## 2011-05-02 MED ORDER — SUCRALFATE 1 G PO TABS
1.0000 g | ORAL_TABLET | Freq: Once | ORAL | Status: AC
  Administered 2011-05-02: 1 g via ORAL
  Filled 2011-05-02: qty 1

## 2011-05-02 MED ORDER — SUCRALFATE 1 GM/10ML PO SUSP: 1.0000 g | Freq: Four times a day (QID) | ORAL | Status: AC

## 2011-05-02 MED ORDER — HYDROMORPHONE HCL PF 1 MG/ML IJ SOLN
1.0000 mg | Freq: Once | INTRAMUSCULAR | Status: AC
  Administered 2011-05-02: 1 mg via INTRAVENOUS
  Filled 2011-05-02: qty 1

## 2011-05-02 MED ORDER — SODIUM CHLORIDE 0.9 % IV SOLN
999.0000 mL | Freq: Once | INTRAVENOUS | Status: AC
  Administered 2011-05-02: 999 mL via INTRAVENOUS

## 2011-05-02 NOTE — ED Notes (Signed)
Pt dressed in to gown and ambulated to restroom to provided [place] with a sample

## 2011-05-02 NOTE — ED Notes (Signed)
Pt with history of ulcerative colitis started having pain on Thursday at 1200. Pt went to hospital and dx with a UTI and [facility]. Today pt is still having pain in lower mid abdomen below the belly button. Bowel sounds present and passing gas. Pt had intestinal infection 10 years ago and he reports "it feel like that, the same pain".

## 2011-05-02 NOTE — ED Notes (Signed)
Pt states "I went to [facility] last Thursday for a UTI, it has not gotten any better, was also seen last Friday @ same place, keep having abd cramps, I've got ulcerative colitis"

## 2011-05-02 NOTE — ED Notes (Signed)
Patient is resting comfortably. 

## 2011-05-02 NOTE — ED Provider Notes (Signed)
History     CSN: [id number] Arrival date & time: 05/02/2011  9:05 AM   First MD Initiated Contact with Patient 05/02/11 0920      Chief Complaint  Patient presents with  . Abdominal Pain    (Consider location/radiation/quality/duration/timing/severity/associated sxs/prior treatment) HPI This is the patient's third presentation in the past week for similar complaints. He notes that his pain began gradually one week ago, and since onset has been largely present though there is mild degrees of waxing and waning. The pain is focally about his epigastrium, with minimal radiation although some pain in his back. The pain is described as sharp, crampy. The pain has not achieved significant relief with home narcotics, or IV narcotics during his prior presentations. He notes that this is similar to prior ulcerative colitis episodes, though different in distribution. No fever, no chills. He continues to have a typical bowel movements, and no emesis since the onset of his symptoms. Past Medical History  Diagnosis Date  . Colitis, ulcerative   . Chronic back pain     Past Surgical History  Procedure Date  . Back surgery   . Hernia repair   . Knee surgery     No family history on file.  History  Substance Use Topics  . Smoking status: Former Games developer  . Smokeless tobacco: Not on file  . Alcohol Use: No      Review of Systems  Constitutional: Positive for appetite change.  HENT: Negative.   Eyes: Negative.   Respiratory: Negative.   Cardiovascular: Negative.   Genitourinary: Negative.   Musculoskeletal: Negative.   Neurological: Negative.   Hematological: Negative.   Psychiatric/Behavioral: Negative.     Allergies  Nsaids and Versed  Home Medications   Current Outpatient Rx  Name Route Sig Dispense Refill  . BUTALBITAL-APAP-CAFFEINE 50-325-40 MG PO TABS Oral Take 1 tablet by mouth every 4 (four) hours as needed. For migraine    . CIPROFLOXACIN HCL 500 MG PO TABS Oral  Take 1 tablet (500 mg total) by mouth 2 (two) times daily. 20 tablet 0  . DIPHENHYDRAMINE HCL 25 MG PO CAPS Oral Take 25 mg by mouth every 6 (six) hours as needed.      Marland Kitchen GABAPENTIN 300 MG PO CAPS Oral Take 300 mg by mouth 3 (three) times daily. Increase by 1 capsule every 4 days until max dose of 1500 mg,pt stopped this med 3 days after starting.     Marland Kitchen HYDROCODONE-ACETAMINOPHEN 7.5-325 MG PO TABS Oral Take 1 tablet by mouth every 6 (six) hours as needed. pain     . HYDROMORPHONE HCL 8 MG PO TB24 Oral Take 8 mg by mouth every 12 (twelve) hours.     Marland Kitchen METHOCARBAMOL 750 MG PO TABS Oral Take 750 mg by mouth 3 (three) times daily as needed. For muscle spasms    . ZOFRAN IV Intravenous Inject 4 mg into the vein once. Given by [facility] to [facility].was given enroute to hospital Friday.pt was given a rx for po tabs but has not filled yet     . POLYETHYLENE GLYCOL 3350 PO PACK Oral Take 17 g by mouth daily.      Marland Kitchen PREGABALIN 225 MG PO CAPS Oral Take 225 mg by mouth 2 (two) times daily.     Marland Kitchen RABEPRAZOLE SODIUM 20 MG PO TBEC Oral Take 20 mg by mouth daily as needed. For acid reflux      . SIMETHICONE 80 MG PO CHEW Oral Chew 80 mg by  mouth every 6 (six) hours as needed.      Marland Kitchen ONDANSETRON 8 MG PO TBDP Oral Take 8 mg by mouth every 8 (eight) hours as needed. Pt has not filled this yet     . OXYMORPHONE HCL (CRUSH RESIST) 10 MG PO TB12 Oral Take 10 mg by mouth 2 (two) times daily.      Marland Kitchen OXYMORPHONE HCL 5 MG PO TABS Oral Take 5-10 mg by mouth every 4 (four) hours as needed. For breakthrough pain     . TAPENTADOL HCL 50 MG PO TABS Oral Take 100 mg by mouth every 4 (four) hours as needed.        BP 143/80  Pulse 77  Temp(Src) 98.1 F (36.7 C) (Oral)  Resp 18  Wt 210 lb (95.255 kg)  SpO2 100%  Physical Exam  Constitutional: He is oriented to person, place, and time. He appears well-developed and well-nourished.  HENT:  Head: Normocephalic and atraumatic.  Eyes: Conjunctivae are normal. Pupils are  equal, round, and reactive to light.  Neck: Neck supple.  Cardiovascular: Normal rate and regular rhythm.   Pulmonary/Chest: No respiratory distress.  Abdominal: Soft. Normal appearance and bowel sounds are normal. There is tenderness in the epigastric area. There is no rigidity, no rebound, no guarding, no tenderness at McBurney's point and negative Murphy's sign.  Musculoskeletal: He exhibits no edema.  Neurological: He is alert and oriented to person, place, and time.  Skin: Skin is warm and dry.  Psychiatric: He has a normal mood and affect.    ED Course  Procedures (including critical care time)   Labs Reviewed  CBC  DIFFERENTIAL  COMPREHENSIVE METABOLIC PANEL  LIPASE, BLOOD  LACTIC ACID, PLASMA   No results found.   No diagnosis found.  Radiographic and lab studies from the past week reviewed.  MDM  This 45 year old male with ulcerative colitis now presents with ongoing abdominal pain. On exam the patient is in no distress, though he has pain with palpation to his epigastrium. The patient's vital signs are stable, and today's laboratory evaluation is similar to that he is received twice in the past week. The patient's radiograph studies were reviewed, and there was no noted acute findings, other than some stool retention. Absent acute findings, and with the patient's known history of ulcerative colitis he is appropriate for continued outpatient evaluation. He has analgesics. He will be discharged to follow up with primary care and GI.        Gerhard Munch, MD 05/02/11 1109

## 2011-06-10 ENCOUNTER — Emergency Department (HOSPITAL_COMMUNITY)
Admission: EM | Admit: 2011-06-10 | Discharge: 2011-06-10 | Disposition: A | Discharge status: Home / Self Care | Payer: Worker's Compensation | Attending: Emergency Medicine | Admitting: Emergency Medicine

## 2011-06-10 ENCOUNTER — Encounter (HOSPITAL_COMMUNITY): Payer: Self-pay | Admitting: *Deleted

## 2011-06-10 DIAGNOSIS — N509 Disorder of male genital organs, unspecified: Secondary | ICD-10-CM | POA: Insufficient documentation

## 2011-06-10 DIAGNOSIS — M549 Dorsalgia, unspecified: Secondary | ICD-10-CM | POA: Insufficient documentation

## 2011-06-10 DIAGNOSIS — N50819 Testicular pain, unspecified: Secondary | ICD-10-CM

## 2011-06-10 DIAGNOSIS — G8929 Other chronic pain: Secondary | ICD-10-CM | POA: Insufficient documentation

## 2011-06-10 DIAGNOSIS — H9209 Otalgia, unspecified ear: Secondary | ICD-10-CM | POA: Insufficient documentation

## 2011-06-10 LAB — URINALYSIS, ROUTINE W REFLEX MICROSCOPIC
Bilirubin Urine: NEGATIVE
Glucose, UA: NEGATIVE mg/dL
Hgb urine dipstick: NEGATIVE
Ketones, ur: NEGATIVE mg/dL
Leukocytes, UA: NEGATIVE
Nitrite: NEGATIVE
Protein, ur: NEGATIVE mg/dL
Specific Gravity, Urine: 1.02 (ref 1.005–1.030)
Urobilinogen, UA: 0.2 mg/dL (ref 0.0–1.0)
pH: 5.5 (ref 5.0–8.0)

## 2011-06-10 MED ORDER — OXYCODONE-ACETAMINOPHEN 5-325 MG PO TABS
2.0000 | ORAL_TABLET | Freq: Once | ORAL | Status: AC
  Administered 2011-06-10: 2 via ORAL
  Filled 2011-06-10: qty 2

## 2011-06-10 MED ORDER — ONDANSETRON 4 MG PO TBDP
4.0000 mg | ORAL_TABLET | Freq: Once | ORAL | Status: AC
  Administered 2011-06-10: 4 mg via ORAL
  Filled 2011-06-10: qty 1

## 2011-06-10 MED ORDER — CIPROFLOXACIN HCL 500 MG PO TABS: 500.0000 mg | ORAL_TABLET | Freq: Two times a day (BID) | ORAL | Status: AC

## 2011-06-10 NOTE — ED Provider Notes (Signed)
History   This is a 45 yo w/ hx of chronic back pain is presenting to ed c/o lower back pain and testicle pain. Pt sts for the past 2 weeks he has been having constant throbbing pain to L testicle.  Pain worsen with palpation.  Denies dysurea, urinary discharge, fever, n/v/d.  Pt sts he has hx of prior ?epididymitis secondary to taking Opana/Lyrica several months ago.  Sxs improves with taking Cipro.  He request for similar abx.  He denies hx of STD, and sts he is faithful to his wife of 20+ years.  Pt also denies rec drug use, IV drug use.  Denies recent trauma.  Denies seeing swelling, rash, or having discharge.  Denies hx of inguinal hernia.    Pt also complain of L ear pain.  Pain is throbbing and tender when using q-tip.  Denies hearing changes, or having discharge or rash.  Pain ongoing for the past week.  Denies swimming in lakes, or pool.    CSN: [id number]  Arrival date & time 06/10/11  1145   First MD Initiated Contact with Patient 06/10/11 1608      Chief Complaint  Patient presents with  . Testicle Pain  . Otalgia    (Consider location/radiation/quality/duration/timing/severity/associated sxs/prior treatment) HPI  Past Medical History  Diagnosis Date  . Colitis, ulcerative   . Chronic back pain   . H/O: GI bleed   . Hx of migraines   . Colitis     History of  . History of acute bronchitis   . History of ulcerative colitis   . Sinusitis     Past Surgical History  Procedure Date  . Back surgery   . Hernia repair   . Knee surgery   . Umbilical hernia repair   . Right thumb surgery   . Right shoulder arthroscopy     No family history on file.  History  Substance Use Topics  . Smoking status: Former Smoker    Types: Cigarettes  . Smokeless tobacco: Never Used  . Alcohol Use: 0.5 oz/week    1 drink(s) per week      Review of Systems  All other systems reviewed and are negative.    Allergies  Nsaids and Versed  Home Medications   Current  Outpatient Rx  Name Route Sig Dispense Refill  . BUTALBITAL-APAP-CAFFEINE 50-325-40 MG PO TABS Oral Take 1 tablet by mouth every 4 (four) hours as needed. For migraine    . DIPHENHYDRAMINE HCL 25 MG PO CAPS Oral Take 25 mg by mouth every 6 (six) hours as needed.      Marland Kitchen GABAPENTIN 300 MG PO CAPS Oral Take 300 mg by mouth 3 (three) times daily. Increase by 1 capsule every 4 days until max dose of 1500 mg,pt stopped this med 3 days after starting.     Marland Kitchen HYDROCODONE-ACETAMINOPHEN 7.5-325 MG PO TABS Oral Take 1 tablet by mouth every 6 (six) hours as needed. pain     . HYDROMORPHONE HCL ER 8 MG PO TB24 Oral Take 8 mg by mouth every 12 (twelve) hours.     Marland Kitchen METHOCARBAMOL 750 MG PO TABS Oral Take 750 mg by mouth 3 (three) times daily as needed. For muscle spasms    . OXYMORPHONE ER (CRUSH RESIST) 10 MG PO TB12 Oral Take 10 mg by mouth 2 (two) times daily.      Marland Kitchen PREGABALIN 225 MG PO CAPS Oral Take 225 mg by mouth 2 (two) times daily.     Marland Kitchen  RABEPRAZOLE SODIUM 20 MG PO TBEC Oral Take 20 mg by mouth daily as needed. For acid reflux      . TAPENTADOL HCL 50 MG PO TABS Oral Take 100 mg by mouth every 4 (four) hours as needed.        BP 120/74  Pulse 70  Temp(Src) 97.7 F (36.5 C) (Oral)  Resp 16  Wt 211 lb (95.709 kg)  SpO2 100%  Physical Exam  Nursing note and vitals reviewed. Constitutional: He appears well-developed and well-nourished. No distress.       Awake, alert, nontoxic appearance  HENT:  Head: Atraumatic.  Eyes: Right eye exhibits no discharge. Left eye exhibits no discharge.  Neck: Neck supple.  Cardiovascular: Normal rate and regular rhythm.   Pulmonary/Chest: Effort normal. He exhibits no tenderness.  Abdominal: Soft. There is no tenderness. There is no rebound and no CVA tenderness. No hernia. Hernia confirmed negative in the ventral area, confirmed negative in the right inguinal area and confirmed negative in the left inguinal area.  Genitourinary: Rectum normal, prostate normal  and penis normal. Right testis shows tenderness. Right testis shows no mass and no swelling. Right testis is descended. Left testis shows tenderness. Left testis shows no swelling. Left testis is descended. Circumcised. No penile erythema or penile tenderness.  Musculoskeletal: He exhibits no tenderness.       Lumbar back: He exhibits tenderness. He exhibits no bony tenderness and no deformity.       Baseline ROM, no obvious new focal weakness  Neurological:       Mental status and motor strength appears baseline for patient and situation  Skin: No rash noted.  Psychiatric: He has a normal mood and affect.    ED Course  Procedures (including critical care time)   Labs Reviewed  URINALYSIS, ROUTINE W REFLEX MICROSCOPIC   No results found.   No diagnosis found.    MDM  Pt w/ 2 weeks of testicular tenderness which felt similar to his prior diagnoses of ?epididymitis.  No obvious concerning on exam.  UA shows no evidence of infection, pt is afebrile.  Pt has prior scrotal US shows no concerning finding.  Although i have low suspicion for bacterial epidydimitis, i did offer scrotal US for further eval, pt refuse at this time.  No obvious evidence of inguinal hernia.  I recommend pt to f/u with urologist for further eval.  No indication for abx use at this time, pt sts it does help with abx.  I will give a prescription and recommend only to take it if he is unable to f/u with urologist, which pt agrees.  At this time, i have low suspicion for testicular torsion as well, or STD, or kidney stones.            Fayrene Helper, PA 06/10/11 [phone]

## 2011-06-10 NOTE — ED Notes (Signed)
Pt states "I was put on Opana for back pain & it caused me to have epidydymitis, was given cipro but I think it's back again, also having left ear pain"

## 2011-06-11 NOTE — ED Provider Notes (Signed)
Medical screening examination/treatment/procedure(s) were performed by non-physician practitioner and as supervising physician I was immediately available for consultation/collaboration.  Ceylon Arenson, MD 06/11/11 0001 

## 2012-01-03 ENCOUNTER — Emergency Department (HOSPITAL_BASED_OUTPATIENT_CLINIC_OR_DEPARTMENT_OTHER): Payer: Worker's Compensation

## 2012-01-03 ENCOUNTER — Encounter (HOSPITAL_BASED_OUTPATIENT_CLINIC_OR_DEPARTMENT_OTHER): Payer: Self-pay | Admitting: *Deleted

## 2012-01-03 ENCOUNTER — Emergency Department (HOSPITAL_BASED_OUTPATIENT_CLINIC_OR_DEPARTMENT_OTHER)
Admission: EM | Admit: 2012-01-03 | Discharge: 2012-01-03 | Disposition: A | Discharge status: Home / Self Care | Payer: Worker's Compensation | Attending: Emergency Medicine | Admitting: Emergency Medicine

## 2012-01-03 DIAGNOSIS — J329 Chronic sinusitis, unspecified: Secondary | ICD-10-CM

## 2012-01-03 DIAGNOSIS — M549 Dorsalgia, unspecified: Secondary | ICD-10-CM

## 2012-01-03 DIAGNOSIS — R42 Dizziness and giddiness: Secondary | ICD-10-CM | POA: Insufficient documentation

## 2012-01-03 DIAGNOSIS — G8929 Other chronic pain: Secondary | ICD-10-CM | POA: Insufficient documentation

## 2012-01-03 DIAGNOSIS — R112 Nausea with vomiting, unspecified: Secondary | ICD-10-CM | POA: Insufficient documentation

## 2012-01-03 LAB — CBC WITH DIFFERENTIAL/PLATELET
Basophils Absolute: 0 10*3/uL (ref 0.0–0.1)
Eosinophils Relative: 1 % (ref 0–5)
HCT: 44.7 % (ref 39.0–52.0)
Hemoglobin: 16 g/dL (ref 13.0–17.0)
Lymphocytes Relative: 16 % (ref 12–46)
Lymphs Abs: 1.3 10*3/uL (ref 0.7–4.0)
MCV: 83.6 fL (ref 78.0–100.0)
Monocytes Absolute: 0.3 10*3/uL (ref 0.1–1.0)
Monocytes Relative: 4 % (ref 3–12)
Neutro Abs: 6.5 10*3/uL (ref 1.7–7.7)
RDW: 12.9 % (ref 11.5–15.5)
WBC: 8.3 10*3/uL (ref 4.0–10.5)

## 2012-01-03 LAB — BASIC METABOLIC PANEL
CO2: 27 mEq/L (ref 19–32)
Chloride: 104 mEq/L (ref 96–112)
Creatinine, Ser: 1 mg/dL (ref 0.50–1.35)
Potassium: 4.5 mEq/L (ref 3.5–5.1)

## 2012-01-03 MED ORDER — ONDANSETRON HCL 4 MG PO TABS: 4.0000 mg | ORAL_TABLET | Freq: Four times a day (QID) | ORAL | Status: AC

## 2012-01-03 MED ORDER — DROPERIDOL 2.5 MG/ML IJ SOLN: 2.5000 mg | Freq: Once | INTRAMUSCULAR | Status: DC

## 2012-01-03 MED ORDER — LORAZEPAM 2 MG/ML IJ SOLN
1.0000 mg | Freq: Once | INTRAMUSCULAR | Status: AC
  Administered 2012-01-03: 1 mg via INTRAVENOUS
  Filled 2012-01-03: qty 1

## 2012-01-03 MED ORDER — ONDANSETRON HCL 4 MG/2ML IJ SOLN
4.0000 mg | Freq: Once | INTRAMUSCULAR | Status: AC
  Administered 2012-01-03: 4 mg via INTRAVENOUS

## 2012-01-03 MED ORDER — LORAZEPAM 1 MG PO TABS: 1.0000 mg | ORAL_TABLET | Freq: Three times a day (TID) | ORAL | Status: AC | PRN

## 2012-01-03 MED ORDER — ONDANSETRON HCL 4 MG/2ML IJ SOLN
INTRAMUSCULAR | Status: AC
  Administered 2012-01-03: 4 mg via INTRAVENOUS
  Filled 2012-01-03: qty 2

## 2012-01-03 MED ORDER — MECLIZINE HCL 25 MG PO TABS
25.0000 mg | ORAL_TABLET | Freq: Once | ORAL | Status: AC
  Administered 2012-01-03: 25 mg via ORAL
  Filled 2012-01-03: qty 1

## 2012-01-03 MED ORDER — HYDROMORPHONE HCL PF 1 MG/ML IJ SOLN
1.0000 mg | Freq: Once | INTRAMUSCULAR | Status: AC
  Administered 2012-01-03: 1 mg via INTRAVENOUS
  Filled 2012-01-03: qty 1

## 2012-01-03 MED ORDER — METOCLOPRAMIDE HCL 5 MG/ML IJ SOLN
10.0000 mg | Freq: Once | INTRAMUSCULAR | Status: AC
  Administered 2012-01-03: 10 mg via INTRAVENOUS
  Filled 2012-01-03: qty 2

## 2012-01-03 MED ORDER — MECLIZINE HCL 50 MG PO TABS: 50.0000 mg | ORAL_TABLET | Freq: Three times a day (TID) | ORAL | Status: AC | PRN

## 2012-01-03 MED ORDER — SODIUM CHLORIDE 0.9 % IV BOLUS (SEPSIS)
1000.0000 mL | Freq: Once | INTRAVENOUS | Status: AC
  Administered 2012-01-03: 1000 mL via INTRAVENOUS

## 2012-01-03 MED ORDER — DIPHENHYDRAMINE HCL 50 MG/ML IJ SOLN
25.0000 mg | Freq: Once | INTRAMUSCULAR | Status: AC
  Administered 2012-01-03: 25 mg via INTRAVENOUS
  Filled 2012-01-03: qty 1

## 2012-01-03 MED ORDER — SODIUM CHLORIDE 0.9 % IV SOLN: Freq: Once | INTRAVENOUS | Status: DC

## 2012-01-03 MED ORDER — OXYMETAZOLINE HCL 0.05 % NA SOLN: 2.0000 | Freq: Two times a day (BID) | NASAL | Status: AC

## 2012-01-03 MED ORDER — MOMETASONE FUROATE 50 MCG/ACT NA SUSP: 2.0000 | Freq: Every day | NASAL | Status: DC

## 2012-01-03 NOTE — ED Notes (Signed)
Iv access attempted x 2, able to obtain labs but not iv access. Brad Fleming, rt to attempt iv access. Pt tolerated well.

## 2012-01-03 NOTE — ED Provider Notes (Signed)
Pt feels much better. Ambulating without difficulty. Will d/c home to f/u with ENT if symptoms persist.   Loren Racer, MD 01/03/12 1944

## 2012-01-03 NOTE — ED Notes (Signed)
Pt amb to room 10 with slow steady gait in nad. Pt reports feeling dizzy and vomiting this am, "the world is spinning around...".  Pt states he had a change in his medications last week from gabapentin to lyrica and thinks this is related.

## 2012-01-03 NOTE — ED Provider Notes (Signed)
History     CSN: [id number]  Arrival date & time 01/03/12  1232   First MD Initiated Contact with Patient 01/03/12 1316      Chief Complaint  Patient presents with  . Dizziness  . Emesis    (Consider location/radiation/quality/duration/timing/severity/associated sxs/prior treatment) Patient is a 46 y.o. male presenting with vomiting. The history is provided by the patient.  Emesis   He had sudden onset this morning of intense vertigo with associated nausea and vomiting. Symptoms are worse with any kind of movement. Nothing makes it better. He tried taking a dose of oral Zofran but vomited after taking it. He denies headache, ear pain, hearing loss, tinnitus. He relates that he was recently switched from gabapentin to Lyrica and every time he has changed medications of care have been side effects and he is worried this is a side effect from that medication change. She's never had vertigo and vomiting like this before. He has had some cold sweats. There've been no fever or chills.  Past Medical History  Diagnosis Date  . Colitis, ulcerative   . Chronic back pain   . H/O: GI bleed   . Hx of migraines   . Colitis     History of  . History of acute bronchitis   . History of ulcerative colitis   . Sinusitis     Past Surgical History  Procedure Date  . Back surgery   . Hernia repair   . Knee surgery   . Umbilical hernia repair   . Right thumb surgery   . Right shoulder arthroscopy     No family history on file.  History  Substance Use Topics  . Smoking status: Former Smoker    Types: Cigarettes  . Smokeless tobacco: Never Used  . Alcohol Use: 0.5 oz/week    1 drink(s) per week      Review of Systems  Gastrointestinal: Positive for vomiting.  All other systems reviewed and are negative.    Allergies  Midazolam hcl and Nsaids  Home Medications   Current Outpatient Rx  Name Route Sig Dispense Refill  . BUTALBITAL-APAP-CAFFEINE 50-325-40 MG PO TABS Oral Take  1 tablet by mouth every 4 (four) hours as needed. For migraine    . DIPHENHYDRAMINE HCL 25 MG PO CAPS Oral Take 25 mg by mouth every 6 (six) hours as needed.     Marland Kitchen GABAPENTIN 300 MG PO CAPS Oral Take 300 mg by mouth daily. Started taking Monday 06/06/11.Marland KitchenMarland KitchenIncrease by 1 capsule every 4 days until max dose of 1500 mg,pt stopped this med 3 days after starting.    Marland Kitchen HYDROCODONE-ACETAMINOPHEN 7.5-325 MG PO TABS Oral Take 1 tablet by mouth every 6 (six) hours as needed. pain     . HYDROMORPHONE HCL ER 8 MG PO TB24 Oral Take 8 mg by mouth every 12 (twelve) hours.     Marland Kitchen METHOCARBAMOL 750 MG PO TABS Oral Take 750 mg by mouth 3 (three) times daily as needed. For muscle spasms    . OXYMORPHONE ER (CRUSH RESIST) 10 MG PO TB12 Oral Take 10 mg by mouth 2 (two) times daily.      Marland Kitchen PREGABALIN 225 MG PO CAPS Oral Take 225 mg by mouth 2 (two) times daily.     Marland Kitchen RABEPRAZOLE SODIUM 20 MG PO TBEC Oral Take 20 mg by mouth daily as needed. For acid reflux      . TAPENTADOL HCL 50 MG PO TABS Oral Take 100 mg by mouth every 4 (  four) hours as needed.        BP 121/70  Pulse 66  Temp 97.8 F (36.6 C) (Oral)  Resp 20  SpO2 99%  Physical Exam  Nursing note and vitals reviewed.  46 year old male who appears uncomfortable. Vital signs are normal. Oxygen saturation is 99% which is normal. Head is normocephalic and atraumatic. PERRLA, EOMI. Nystagmus is noted on lateral gaze in either direction. There is no rotary nystagmus noted. Oropharynx clear. Neck is nontender and supple. Back is nontender. Lungs are clear without rales, wheezes, or rhonchi. Heart has regular rate and rhythm without murmur. Abdomen is soft, flat, nontender without masses or hepatosplenomegaly. Peristalsis is decreased. Extremities have full range of motion, no cyanosis or edema. Skin is warm and dry without rash. Neurologic: Mental status is normal, cranial nerves are intact, there are no motor or sensory deficits.  ED Course  Procedures (including  critical care time)  Results for orders placed during the hospital encounter of 01/03/12  BASIC METABOLIC PANEL      Component Value Range   Sodium 139  135 - 145 mEq/L   Potassium 4.5  3.5 - 5.1 mEq/L   Chloride 104  96 - 112 mEq/L   CO2 27  19 - 32 mEq/L   Glucose, Bld 103 (*) 70 - 99 mg/dL   BUN 14  6 - 23 mg/dL   Creatinine, Ser 1.61  0.50 - 1.35 mg/dL   Calcium 9.7  8.4 - 09.6 mg/dL   GFR calc non Af Amer 90 (*) >90 mL/min   GFR calc Af Amer >90  >90 mL/min  CBC WITH DIFFERENTIAL      Component Value Range   WBC 8.3  4.0 - 10.5 K/uL   RBC 5.35  4.22 - 5.81 MIL/uL   Hemoglobin 16.0  13.0 - 17.0 g/dL   HCT 04.5  40.9 - 81.1 %   MCV 83.6  78.0 - 100.0 fL   MCH 29.9  26.0 - 34.0 pg   MCHC 35.8  30.0 - 36.0 g/dL   RDW 91.4  78.2 - 95.6 %   Platelets 193  150 - 400 K/uL   Neutrophils Relative 79 (*) 43 - 77 %   Neutro Abs 6.5  1.7 - 7.7 K/uL   Lymphocytes Relative 16  12 - 46 %   Lymphs Abs 1.3  0.7 - 4.0 K/uL   Monocytes Relative 4  3 - 12 %   Monocytes Absolute 0.3  0.1 - 1.0 K/uL   Eosinophils Relative 1  0 - 5 %   Eosinophils Absolute 0.1  0.0 - 0.7 K/uL   Basophils Relative 0  0 - 1 %   Basophils Absolute 0.0  0.0 - 0.1 K/uL    ECG shows normal sinus rhythm with a rate of 68, no ectopy. Normal axis. Normal P wave. Normal QRS. Normal intervals. Normal ST and T waves. Impression: normal ECG. When compared with ECG of 03/15/2010, no significant changes are seen.   1. Vertigo   2. Nausea and vomiting   3. Chronic back pain       MDM  Apparent episode of peripheral vertigo. He'll begin IV fluids. He has an allergy to diazepam but he cannot remember what it was, so we will not be given lorazepam which might cross react with that. Instead, he will be given droperidol and meclizine. ECG has been done which shows no prolongation of QT interval.  He was given IV fluids, IV ondansetron, and oral  meclizine but has not noted any improvement. Also, he was complaining of back  pain for which she takes Norco 10-325 but has not been able to take his Norco today, she was given a dose of hydromorphone with little relief of pain. Hydromorphone dose will be reviewed today.Droperidol is not available in our system currently. He will be given another dose of the hydromorphone and given a dose of lorazepam. Concern was originally present about a possible cross reaction of lorazepam and basilar. However, he states that the mid basilar allergy is nausea and vomiting when he wakes up from being sedated for procedures. Since he is being given antiemetics, it should not be a problem to give him lorazepam. Because of failure to respond to initial treatments, a CT scan will be obtained. If he fails to respond with 2 additional measures, we'll need to get MRI scan. Case is endorsed to Dr. Ranae Palms.      Dione Booze, MD 01/03/12 [phone]

## 2012-09-17 IMAGING — CR DG ABDOMEN ACUTE W/ 1V CHEST
3 series · 3 of 3 positions shown · non-contrast
Comparison: CT of the abdomen and pelvis performed 04/28/2011, and
chest radiograph performed 06/27/2008

CLINICAL DATA: Abdominal pain and rectal bleeding; history of
ulcerative colitis.

ACUTE ABDOMEN SERIES (ABDOMEN 2 VIEW & CHEST 1 VIEW)

[w chest pa]
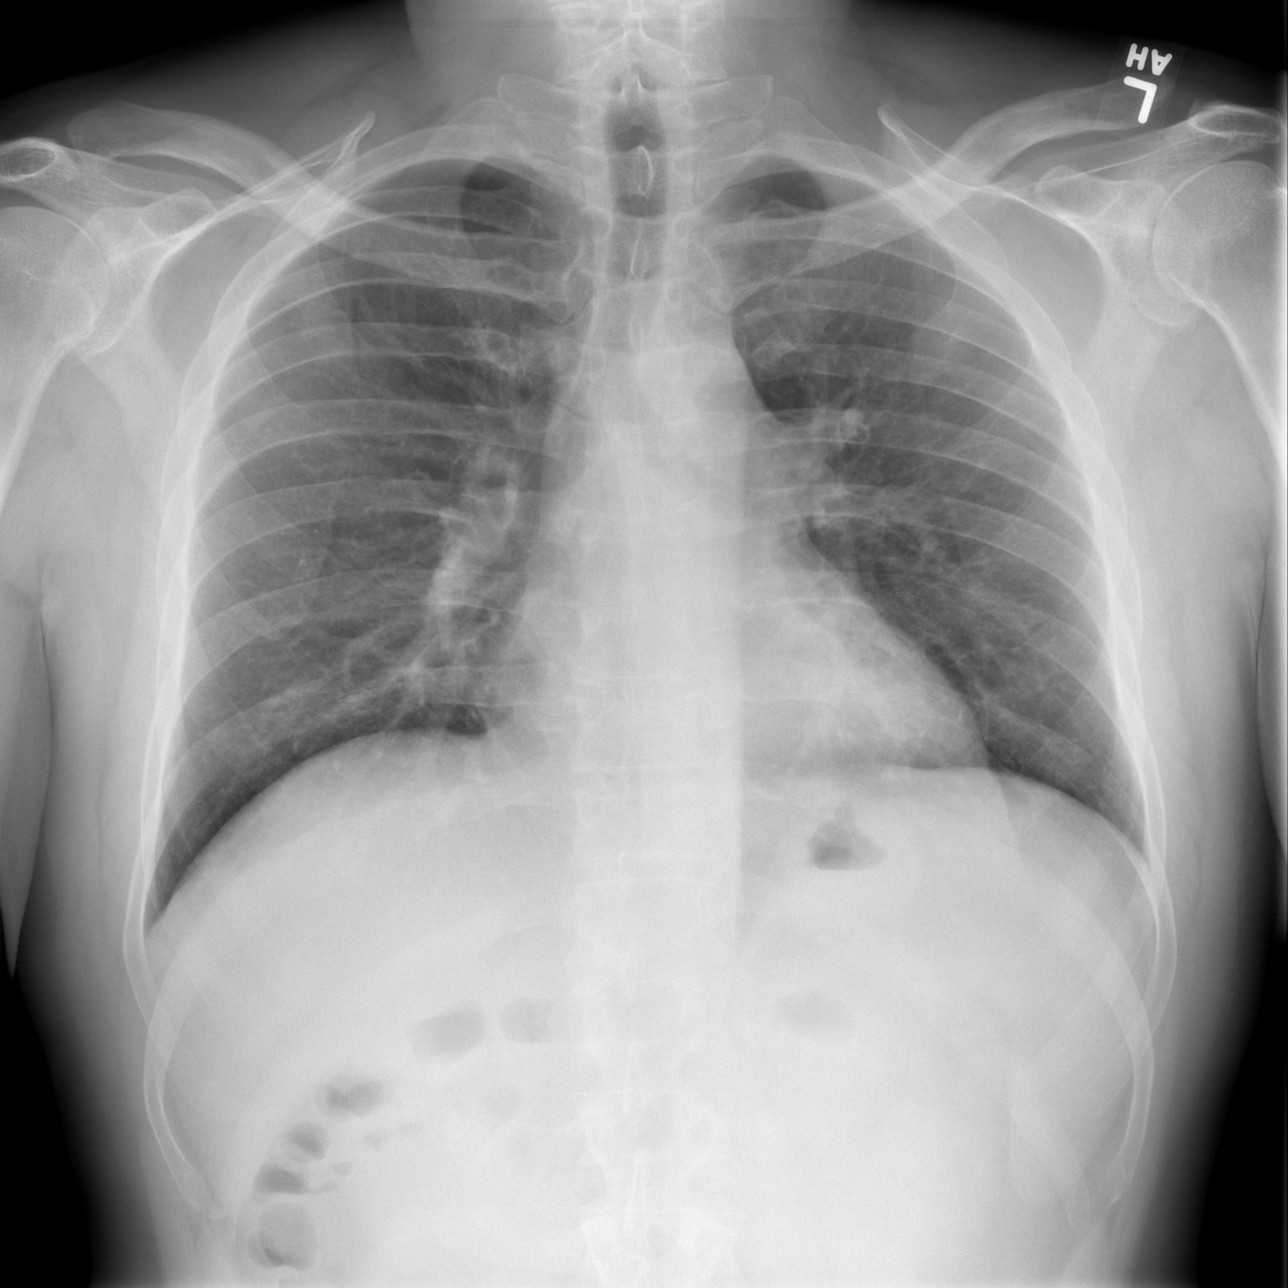

[w abdomen upright]
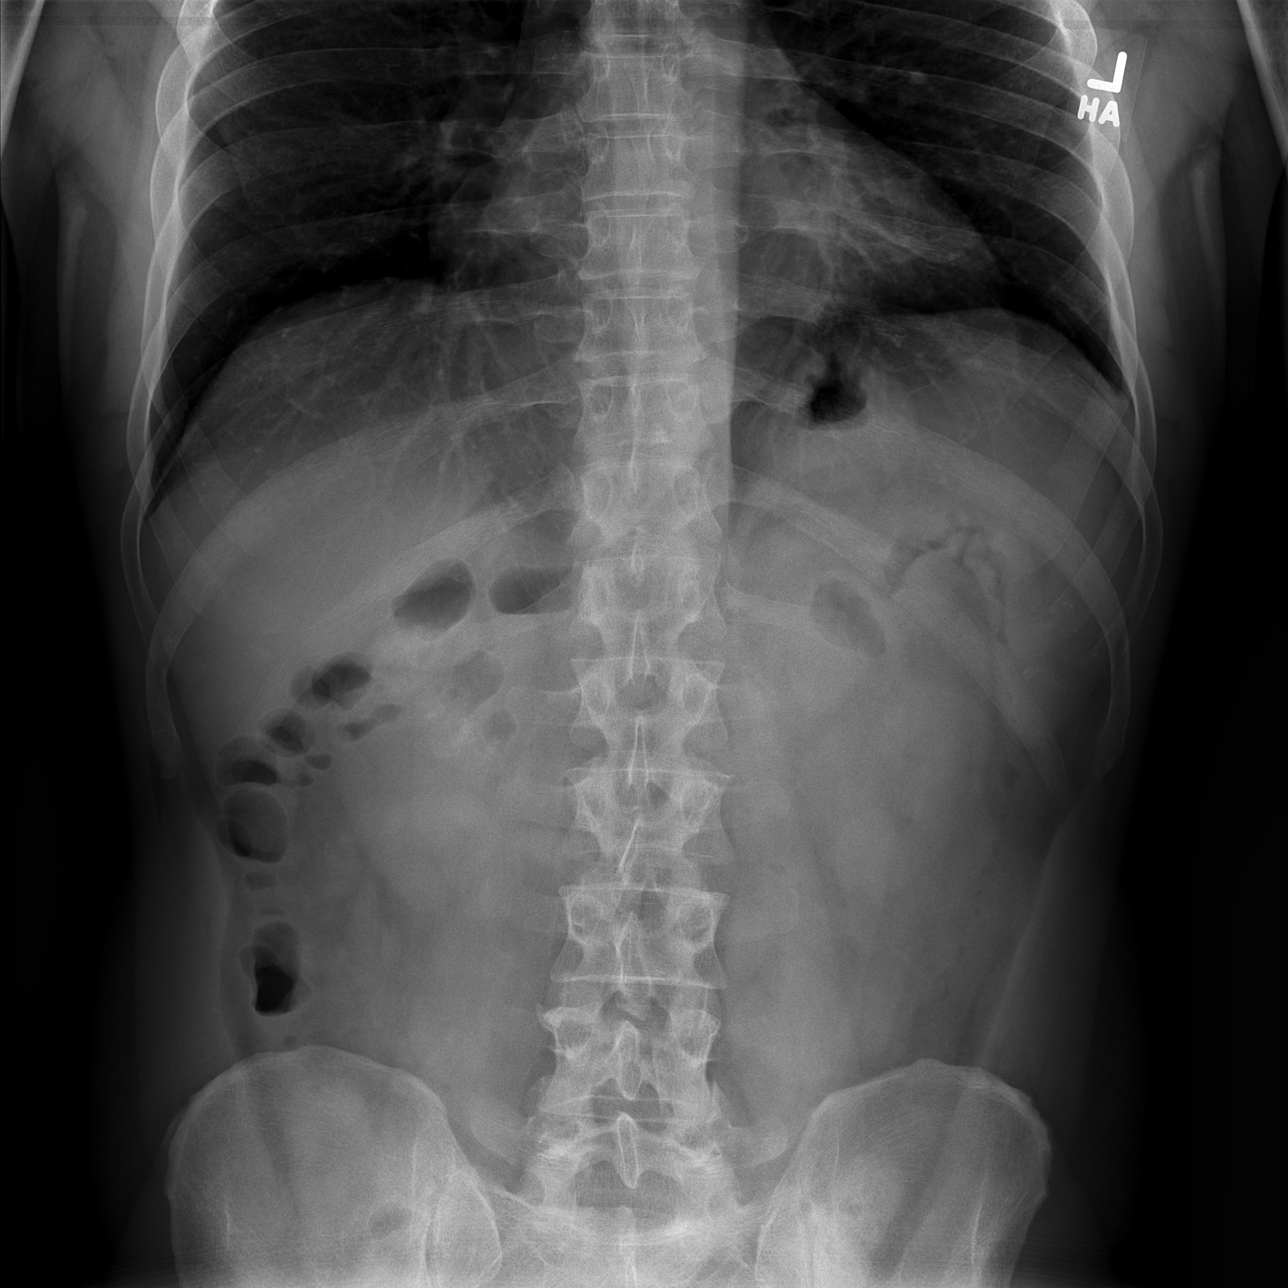

[t abdomen supine]
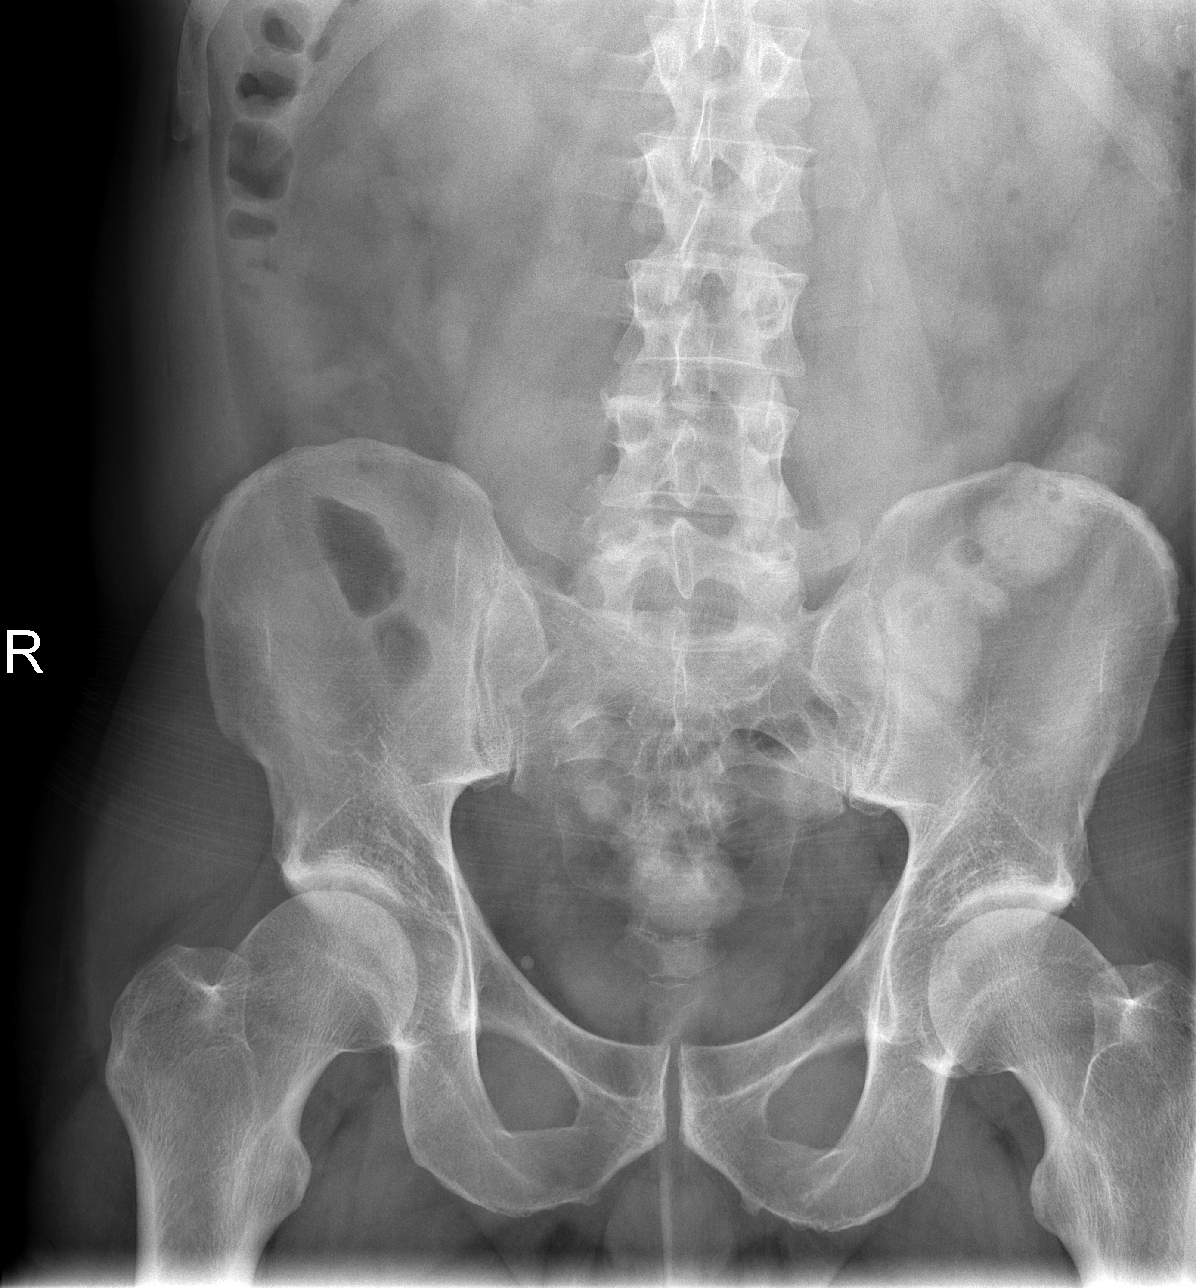

[3 of 3 positions shown; findings below may reference images not displayed]

FINDINGS: The lungs are well-aerated and clear.  There is no
evidence of focal opacification, pleural effusion or pneumothorax.
The cardiomediastinal silhouette is within normal limits.

The visualized bowel gas pattern is unremarkable.  Scattered air
and fluid are noted within the colon; there is no evidence of small
bowel dilatation to suggest obstruction.  No free intra-abdominal
air is identified on the provided upright view.

No acute osseous abnormalities are seen; the sacroiliac joints are
unremarkable in appearance.
IMPRESSION: 1.  Unremarkable bowel gas pattern; no free intra-abdominal air
seen.
2.  No acute cardiopulmonary process identified.

## 2012-10-06 ENCOUNTER — Emergency Department (HOSPITAL_BASED_OUTPATIENT_CLINIC_OR_DEPARTMENT_OTHER): Payer: BC Managed Care – PPO

## 2012-10-06 ENCOUNTER — Emergency Department (HOSPITAL_BASED_OUTPATIENT_CLINIC_OR_DEPARTMENT_OTHER)
Admission: EM | Admit: 2012-10-06 | Discharge: 2012-10-06 | Disposition: A | Discharge status: Home / Self Care | Payer: BC Managed Care – PPO | Attending: Emergency Medicine | Admitting: Emergency Medicine

## 2012-10-06 ENCOUNTER — Encounter (HOSPITAL_BASED_OUTPATIENT_CLINIC_OR_DEPARTMENT_OTHER): Payer: Self-pay | Admitting: Emergency Medicine

## 2012-10-06 DIAGNOSIS — Z8639 Personal history of other endocrine, nutritional and metabolic disease: Secondary | ICD-10-CM | POA: Insufficient documentation

## 2012-10-06 DIAGNOSIS — Z8739 Personal history of other diseases of the musculoskeletal system and connective tissue: Secondary | ICD-10-CM | POA: Insufficient documentation

## 2012-10-06 DIAGNOSIS — R079 Chest pain, unspecified: Secondary | ICD-10-CM

## 2012-10-06 DIAGNOSIS — Z87891 Personal history of nicotine dependence: Secondary | ICD-10-CM | POA: Insufficient documentation

## 2012-10-06 DIAGNOSIS — Z79899 Other long term (current) drug therapy: Secondary | ICD-10-CM | POA: Insufficient documentation

## 2012-10-06 DIAGNOSIS — R61 Generalized hyperhidrosis: Secondary | ICD-10-CM | POA: Insufficient documentation

## 2012-10-06 DIAGNOSIS — G43909 Migraine, unspecified, not intractable, without status migrainosus: Secondary | ICD-10-CM | POA: Insufficient documentation

## 2012-10-06 DIAGNOSIS — R110 Nausea: Secondary | ICD-10-CM | POA: Insufficient documentation

## 2012-10-06 DIAGNOSIS — R0789 Other chest pain: Secondary | ICD-10-CM | POA: Insufficient documentation

## 2012-10-06 DIAGNOSIS — Z8719 Personal history of other diseases of the digestive system: Secondary | ICD-10-CM | POA: Insufficient documentation

## 2012-10-06 DIAGNOSIS — K219 Gastro-esophageal reflux disease without esophagitis: Secondary | ICD-10-CM | POA: Insufficient documentation

## 2012-10-06 DIAGNOSIS — G8929 Other chronic pain: Secondary | ICD-10-CM | POA: Insufficient documentation

## 2012-10-06 DIAGNOSIS — R5381 Other malaise: Secondary | ICD-10-CM | POA: Insufficient documentation

## 2012-10-06 DIAGNOSIS — R0602 Shortness of breath: Secondary | ICD-10-CM | POA: Insufficient documentation

## 2012-10-06 DIAGNOSIS — R42 Dizziness and giddiness: Secondary | ICD-10-CM | POA: Insufficient documentation

## 2012-10-06 DIAGNOSIS — F329 Major depressive disorder, single episode, unspecified: Secondary | ICD-10-CM | POA: Insufficient documentation

## 2012-10-06 DIAGNOSIS — Z8709 Personal history of other diseases of the respiratory system: Secondary | ICD-10-CM | POA: Insufficient documentation

## 2012-10-06 DIAGNOSIS — Z862 Personal history of diseases of the blood and blood-forming organs and certain disorders involving the immune mechanism: Secondary | ICD-10-CM | POA: Insufficient documentation

## 2012-10-06 DIAGNOSIS — F3289 Other specified depressive episodes: Secondary | ICD-10-CM | POA: Insufficient documentation

## 2012-10-06 HISTORY — DX: Hyperlipidemia, unspecified: E78.5

## 2012-10-06 HISTORY — DX: Major depressive disorder, single episode, unspecified: F32.9

## 2012-10-06 HISTORY — DX: Migraine, unspecified, not intractable, without status migrainosus: G43.909

## 2012-10-06 HISTORY — DX: Depression, unspecified: F32.A

## 2012-10-06 HISTORY — DX: Gastro-esophageal reflux disease without esophagitis: K21.9

## 2012-10-06 LAB — CBC WITH DIFFERENTIAL/PLATELET
Basophils Absolute: 0 10*3/uL (ref 0.0–0.1)
Basophils Relative: 0 % (ref 0–1)
Eosinophils Absolute: 0.4 10*3/uL (ref 0.0–0.7)
Eosinophils Relative: 6 % — ABNORMAL HIGH (ref 0–5)
HCT: 42.9 % (ref 39.0–52.0)
Hemoglobin: 15.1 g/dL (ref 13.0–17.0)
MCH: 30.5 pg (ref 26.0–34.0)
MCHC: 35.2 g/dL (ref 30.0–36.0)
MCV: 86.7 fL (ref 78.0–100.0)
Monocytes Absolute: 0.6 10*3/uL (ref 0.1–1.0)
Monocytes Relative: 10 % (ref 3–12)
RDW: 12.9 % (ref 11.5–15.5)

## 2012-10-06 LAB — TROPONIN I: Troponin I: <0.3 ng/mL (ref <0.30)

## 2012-10-06 LAB — BASIC METABOLIC PANEL
Calcium: 9 mg/dL (ref 8.4–10.5)
Creatinine, Ser: 1 mg/dL (ref 0.50–1.35)
GFR calc Af Amer: >90 mL/min (ref >90)

## 2012-10-06 MED ORDER — PANTOPRAZOLE SODIUM 40 MG IV SOLR
40.0000 mg | Freq: Once | INTRAVENOUS | Status: AC
  Administered 2012-10-06: 40 mg via INTRAVENOUS
  Filled 2012-10-06: qty 40

## 2012-10-06 MED ORDER — HYDROMORPHONE HCL PF 1 MG/ML IJ SOLN
1.0000 mg | Freq: Once | INTRAMUSCULAR | Status: AC
  Administered 2012-10-06: 1 mg via INTRAVENOUS
  Filled 2012-10-06: qty 1

## 2012-10-06 MED ORDER — GI COCKTAIL ~~LOC~~
30.0000 mL | Freq: Once | ORAL | Status: AC
  Administered 2012-10-06: 30 mL via ORAL
  Filled 2012-10-06: qty 30

## 2012-10-06 MED ORDER — ONDANSETRON HCL 4 MG/2ML IJ SOLN
4.0000 mg | Freq: Once | INTRAMUSCULAR | Status: AC
  Administered 2012-10-06: 4 mg via INTRAVENOUS
  Filled 2012-10-06: qty 2

## 2012-10-06 MED ORDER — DIPHENHYDRAMINE HCL 50 MG/ML IJ SOLN
25.0000 mg | Freq: Once | INTRAMUSCULAR | Status: AC
  Administered 2012-10-06: 25 mg via INTRAVENOUS
  Filled 2012-10-06: qty 1

## 2012-10-06 MED ORDER — METOCLOPRAMIDE HCL 5 MG/ML IJ SOLN
10.0000 mg | Freq: Once | INTRAMUSCULAR | Status: AC
  Administered 2012-10-06: 10 mg via INTRAVENOUS
  Filled 2012-10-06: qty 2

## 2012-10-06 MED ORDER — NITROGLYCERIN 0.4 MG SL SUBL
0.4000 mg | SUBLINGUAL_TABLET | SUBLINGUAL | Status: DC | PRN
  Administered 2012-10-06: 0.4 mg via SUBLINGUAL
  Filled 2012-10-06: qty 25

## 2012-10-06 NOTE — ED Notes (Signed)
Wife reports patient has had migraine since 4/8, working with pcp to manage medications for migraines, however since nitro headache has significantly increased. Pt also took his own supply of fiorcet in er

## 2012-10-06 NOTE — ED Notes (Signed)
Pt having chest pain with sob.  Pt states he was lying down at the time.  Some dizziness, weakness, diaphoresis and Nausea.

## 2012-10-06 NOTE — ED Provider Notes (Signed)
History     CSN: [id number]  Arrival date & time 10/06/12  1258   First MD Initiated Contact with Patient 10/06/12 1327      Chief Complaint  Patient presents with  . Chest Pain    (Consider location/radiation/quality/duration/timing/severity/associated sxs/prior treatment) HPI Comments: Patient is a 47 year old male with a past medical history of ulcerative colitis, migraines, GERD, hyperlipidemia and depression who presents with chest pain that started this afternoon. Patient was laying down when he had sudden onset of sharp left sided chest pain that radiated into his left arm and left neck. He reports associated dizziness, weakness, diaphoresis, and nausea. The pain lasted a few minutes before spontaneously resolving without intervention. Patient is not a smoker and does have a family history of early heart disease in immediate family. No aggravating/alleviaitng factors. Patient reports having another episode of the same symptoms when he got to the ED. Patient reports starting new medications, Lexapro and Desyrel, 1 week ago.    Past Medical History  Diagnosis Date  . Colitis, ulcerative   . Chronic back pain   . H/O: GI bleed   . Hx of migraines   . Colitis     History of  . History of acute bronchitis   . History of ulcerative colitis   . Sinusitis   . Depression   . GERD (gastroesophageal reflux disease)   . Hyperlipidemia   . Depression   . Migraine     Past Surgical History  Procedure Laterality Date  . Back surgery    . Hernia repair    . Knee surgery    . Umbilical hernia repair    . Right thumb surgery    . Right shoulder arthroscopy      History reviewed. No pertinent family history.  History  Substance Use Topics  . Smoking status: Former Smoker    Types: Cigarettes  . Smokeless tobacco: Never Used  . Alcohol Use: 0.5 oz/week    1 drink(s) per week      Review of Systems  Constitutional: Positive for diaphoresis.  Respiratory: Positive for  shortness of breath.   Cardiovascular: Positive for chest pain.  Neurological: Positive for dizziness and weakness.  All other systems reviewed and are negative.    Allergies  Midazolam hcl; Nsaids; and Opana  Home Medications   Current Outpatient Rx  Name  Route  Sig  Dispense  Refill  . butalbital-acetaminophen-caffeine (FIORICET, ESGIC) 50-325-40 MG per tablet   Oral   Take 1 tablet by mouth every 4 (four) hours as needed. For migraine         . diphenhydrAMINE (BENADRYL) 25 mg capsule   Oral   Take 25 mg by mouth every 6 (six) hours as needed.          . gabapentin (NEURONTIN) 300 MG capsule   Oral   Take 300 mg by mouth daily. Started taking Monday 06/06/11.Marland KitchenMarland KitchenIncrease by 1 capsule every 4 days until max dose of 1500 mg,pt stopped this med 3 days after starting.         Marland Kitchen HYDROcodone-acetaminophen (NORCO) 10-325 MG per tablet   Oral   Take 1 tablet by mouth every 6 (six) hours as needed.         . methocarbamol (ROBAXIN) 750 MG tablet   Oral   Take 750 mg by mouth 3 (three) times daily as needed. For muscle spasms         . mometasone (NASONEX) 50 MCG/ACT nasal spray  Nasal   Place 2 sprays into the nose daily.   17 g   12   . pregabalin (LYRICA) 225 MG capsule   Oral   Take 225 mg by mouth 2 (two) times daily.          . RABEprazole (ACIPHEX) 20 MG tablet   Oral   Take 20 mg by mouth daily as needed. For acid reflux             BP 120/94  Pulse 71  Temp(Src) 97.7 F (36.5 C) (Oral)  Resp 16  Ht 5' 11.75" (1.822 m)  Wt 205 lb (92.987 kg)  BMI 28.01 kg/m2  SpO2 100%  Physical Exam  Nursing note and vitals reviewed. Constitutional: He is oriented to person, place, and time. He appears well-developed and well-nourished. No distress.  HENT:  Head: Normocephalic and atraumatic.  Eyes: Conjunctivae are normal.  Neck: Normal range of motion. Neck supple.  Cardiovascular: Normal rate and regular rhythm.  Exam reveals no gallop and no  friction rub.   No murmur heard. Pulmonary/Chest: Effort normal and breath sounds normal. He has no wheezes. He has no rales. He exhibits no tenderness.  Abdominal: Soft. He exhibits no distension. There is no tenderness. There is no rebound and no guarding.  Musculoskeletal: Normal range of motion.  Neurological: He is alert and oriented to person, place, and time. Coordination normal.  Speech is goal-oriented. Moves limbs without ataxia.   Skin: Skin is warm and dry.  Psychiatric: He has a normal mood and affect. His behavior is normal.    ED Course  Procedures (including critical care time)   Date: 10/06/2012  Rate: 73  Rhythm: normal sinus rhythm  QRS Axis: normal  Intervals: normal  ST/T Wave abnormalities: normal  Conduction Disutrbances:none  Narrative Interpretation: NSR without previous for comparison  Old EKG Reviewed: none available    Labs Reviewed  CBC WITH DIFFERENTIAL - Abnormal; Notable for the following:    Neutrophils Relative 40 (*)    Eosinophils Relative 6 (*)    All other components within normal limits  BASIC METABOLIC PANEL - Abnormal; Notable for the following:    GFR calc non Af Amer 89 (*)    All other components within normal limits  TROPONIN I  TROPONIN I  TROPONIN I  TROPONIN I   Dg Chest 2 View  10/06/2012  *RADIOLOGY REPORT*  Clinical Data: Chest pain  CHEST - 2 VIEW  Comparison: 06/27/2008  Findings: Heart and mediastinal contours are within normal limits. No focal opacities or effusions.  No acute bony abnormality.  IMPRESSION: No active cardiopulmonary disease.   Original Report Authenticated By: Charlett Nose, M.D.      1. Chest pain       MDM  2:23 PM Labs pending.   4:19 PM Labs unremarkable. Chest xray unremarkable. Troponin negative.   8:53 PM Repeat troponin negative. Patient is feeling better. I will have patient follow up outpatient cardiology for further evaluation. Patient will also be advised to follow up with PCP  for evaluation of new medications. Vitals stable and patient is afebrile. Patient instructed to return with worsening or concerning symptoms.      Emilia Beck, PA-C 10/06/12 2128

## 2012-10-07 NOTE — ED Provider Notes (Signed)
Medical screening examination/treatment/procedure(s) were performed by non-physician practitioner and as supervising physician I was immediately available for consultation/collaboration.  Ethelda Chick, MD 10/07/12 [phone]

## 2012-10-12 ENCOUNTER — Encounter (INDEPENDENT_AMBULATORY_CARE_PROVIDER_SITE_OTHER): Payer: Self-pay | Admitting: General Surgery

## 2012-10-12 ENCOUNTER — Ambulatory Visit (INDEPENDENT_AMBULATORY_CARE_PROVIDER_SITE_OTHER): Payer: BC Managed Care – PPO | Admitting: General Surgery

## 2012-10-12 VITALS — BP 132/76 | HR 78 | Temp 97.8°F | Resp 18 | Ht 71.75 in | Wt 201.0 lb

## 2012-10-12 DIAGNOSIS — IMO0002 Reserved for concepts with insufficient information to code with codable children: Secondary | ICD-10-CM

## 2012-10-12 DIAGNOSIS — L723 Sebaceous cyst: Secondary | ICD-10-CM

## 2012-10-12 NOTE — Progress Notes (Signed)
Patient ID: Brad Fleming, male   DOB: Sep 07, 1965, 47 y.o.   MRN: [id number] History: This gentleman was referred to the urgent office by Dr. 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 Patient states this has been swelling and draining for about 6 weeks.  Past history, family history, social history, and review of systems all reviewed. They're documented on the chart. There are noncontributory except as described above.  Exam: Patient is awake and alert. Ambulating with a cane because of chronic back problems. No significant distress. Abdomen soft and nontender. Genitalia palpable mass left inguinal area, 1.5 cm. A recent area of drainage or incision. Not heavily infected. No active drainage.  Procedure note: Betadine prep. 1% Xylocaine with epinephrine local. Oblique elliptical incision made completely excising a chronically scarred and inflamed area. No purulence found. Hemostasis very good. Closed with a single suture of 3-0 nylon to allow drainage at either end. Dressed with dry gauze  Assessment: Chronically infected cyst left inguinal area, 1.5 cm, excised today  Plan: Wound care discussed. Dry gauze bandage. Okay to shower. No indication for antibiotics or narcotics Return in 10 days for suture removal.   Angelia Mould. Derrell Lolling, M.D., [facility] General and Minimally invasive Surgery Breast and Colorectal Surgery Office:   [phone] Pager:   [phone]

## 2012-10-12 NOTE — Patient Instructions (Signed)
The subcutaneous mass in your left groin is probably a chronically infected cyst. This area was completely excised today. It was closed with a single suture.  This  will drain a little bit, so keep a dry gauze bandage on it. Change the bandage as necessary. No ointments or creams.  You may shower, starting tomorrow.  You will be given an appointment to return in approximately 10 days to have the sutures removed.

## 2012-10-22 ENCOUNTER — Ambulatory Visit (INDEPENDENT_AMBULATORY_CARE_PROVIDER_SITE_OTHER): Payer: BC Managed Care – PPO

## 2012-10-22 VITALS — BP 128/76 | HR 74 | Temp 99.9°F | Resp 18 | Ht 71.75 in | Wt 202.0 lb

## 2012-10-22 DIAGNOSIS — Z4802 Encounter for removal of sutures: Secondary | ICD-10-CM

## 2012-10-22 NOTE — Progress Notes (Signed)
Patient in for suture removal/ Nurse only; Left inguinal incision with one suture intact ; slight redness no active drainage noted. Cleansed area with Normal saline ; removed suture; Applied triple ABT to area ; applied dry dressing secured with tape; Patient tolerated well; Patient is to return to see Dr. Ezzard Standing on 10/25/12 @ 2:45p for cyst on his back. Advised patient to keep area clean and dry and temp greater than 100.6 to call office. Patient verbalized understanding.

## 2012-10-25 ENCOUNTER — Encounter (INDEPENDENT_AMBULATORY_CARE_PROVIDER_SITE_OTHER): Payer: BC Managed Care – PPO | Admitting: Surgery

## 2012-11-01 ENCOUNTER — Emergency Department (HOSPITAL_COMMUNITY): Payer: BC Managed Care – PPO

## 2012-11-01 ENCOUNTER — Encounter (HOSPITAL_COMMUNITY): Payer: Self-pay | Admitting: Emergency Medicine

## 2012-11-01 ENCOUNTER — Emergency Department (HOSPITAL_COMMUNITY)
Admission: EM | Admit: 2012-11-01 | Discharge: 2012-11-01 | Disposition: A | Discharge status: Home / Self Care | Payer: BC Managed Care – PPO | Attending: Emergency Medicine | Admitting: Emergency Medicine

## 2012-11-01 DIAGNOSIS — Y92410 Unspecified street and highway as the place of occurrence of the external cause: Secondary | ICD-10-CM | POA: Insufficient documentation

## 2012-11-01 DIAGNOSIS — K219 Gastro-esophageal reflux disease without esophagitis: Secondary | ICD-10-CM | POA: Insufficient documentation

## 2012-11-01 DIAGNOSIS — F329 Major depressive disorder, single episode, unspecified: Secondary | ICD-10-CM | POA: Insufficient documentation

## 2012-11-01 DIAGNOSIS — Z8709 Personal history of other diseases of the respiratory system: Secondary | ICD-10-CM | POA: Insufficient documentation

## 2012-11-01 DIAGNOSIS — S46909A Unspecified injury of unspecified muscle, fascia and tendon at shoulder and upper arm level, unspecified arm, initial encounter: Secondary | ICD-10-CM | POA: Insufficient documentation

## 2012-11-01 DIAGNOSIS — S0993XA Unspecified injury of face, initial encounter: Secondary | ICD-10-CM | POA: Insufficient documentation

## 2012-11-01 DIAGNOSIS — Z8639 Personal history of other endocrine, nutritional and metabolic disease: Secondary | ICD-10-CM | POA: Insufficient documentation

## 2012-11-01 DIAGNOSIS — F3289 Other specified depressive episodes: Secondary | ICD-10-CM | POA: Insufficient documentation

## 2012-11-01 DIAGNOSIS — Z8719 Personal history of other diseases of the digestive system: Secondary | ICD-10-CM | POA: Insufficient documentation

## 2012-11-01 DIAGNOSIS — G43909 Migraine, unspecified, not intractable, without status migrainosus: Secondary | ICD-10-CM | POA: Insufficient documentation

## 2012-11-01 DIAGNOSIS — S4980XA Other specified injuries of shoulder and upper arm, unspecified arm, initial encounter: Secondary | ICD-10-CM | POA: Insufficient documentation

## 2012-11-01 DIAGNOSIS — Y9389 Activity, other specified: Secondary | ICD-10-CM | POA: Insufficient documentation

## 2012-11-01 DIAGNOSIS — Z87891 Personal history of nicotine dependence: Secondary | ICD-10-CM | POA: Insufficient documentation

## 2012-11-01 DIAGNOSIS — Z862 Personal history of diseases of the blood and blood-forming organs and certain disorders involving the immune mechanism: Secondary | ICD-10-CM | POA: Insufficient documentation

## 2012-11-01 DIAGNOSIS — Z79899 Other long term (current) drug therapy: Secondary | ICD-10-CM | POA: Insufficient documentation

## 2012-11-01 DIAGNOSIS — T148XXA Other injury of unspecified body region, initial encounter: Secondary | ICD-10-CM

## 2012-11-01 MED ORDER — MORPHINE SULFATE 4 MG/ML IJ SOLN
4.0000 mg | Freq: Once | INTRAMUSCULAR | Status: AC
  Administered 2012-11-01: 4 mg via INTRAMUSCULAR
  Filled 2012-11-01: qty 1

## 2012-11-01 MED ORDER — METHOCARBAMOL 500 MG PO TABS: 500.0000 mg | ORAL_TABLET | Freq: Two times a day (BID) | ORAL | Status: DC

## 2012-11-01 MED ORDER — ONDANSETRON 4 MG PO TBDP
8.0000 mg | ORAL_TABLET | Freq: Once | ORAL | Status: AC
  Administered 2012-11-01: 8 mg via ORAL
  Filled 2012-11-01: qty 2

## 2012-11-01 MED ORDER — PROMETHAZINE HCL 25 MG PO TABS
25.0000 mg | ORAL_TABLET | Freq: Four times a day (QID) | ORAL | Status: DC | PRN
Start: 2012-11-01 — End: 2014-02-05

## 2012-11-01 MED ORDER — HYDROCODONE-ACETAMINOPHEN 5-325 MG PO TABS: 2.0000 | ORAL_TABLET | ORAL | Status: DC | PRN
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
## 2012-11-01 NOTE — ED Notes (Signed)
LSB removed while maintaining C spine.
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
## 2012-11-03 NOTE — ED Provider Notes (Signed)
  Medical screening examination/treatment/procedure(s) were performed by non-physician practitioner and as supervising physician I was immediately available for consultation/collaboration.    Jillene Wehrenberg, MD 11/03/12 0848 

## 2012-12-04 ENCOUNTER — Other Ambulatory Visit: Payer: Self-pay | Admitting: Family Medicine

## 2012-12-04 DIAGNOSIS — M546 Pain in thoracic spine: Secondary | ICD-10-CM

## 2012-12-11 ENCOUNTER — Other Ambulatory Visit: Payer: Self-pay | Admitting: Family Medicine

## 2012-12-11 ENCOUNTER — Inpatient Hospital Stay
Admission: RE | Admit: 2012-12-11 | Discharge: 2012-12-11 | Disposition: A | Discharge status: Home / Self Care | Payer: Self-pay | Source: Ambulatory Visit | Attending: Family Medicine | Admitting: Family Medicine

## 2012-12-11 ENCOUNTER — Ambulatory Visit
Admission: RE | Admit: 2012-12-11 | Discharge: 2012-12-11 | Disposition: A | Discharge status: Home / Self Care | Payer: BC Managed Care – PPO | Source: Ambulatory Visit | Attending: Family Medicine | Admitting: Family Medicine

## 2012-12-11 DIAGNOSIS — M546 Pain in thoracic spine: Secondary | ICD-10-CM

## 2012-12-11 DIAGNOSIS — R52 Pain, unspecified: Secondary | ICD-10-CM

## 2012-12-14 ENCOUNTER — Encounter (INDEPENDENT_AMBULATORY_CARE_PROVIDER_SITE_OTHER): Payer: Self-pay | Admitting: Surgery

## 2012-12-14 ENCOUNTER — Ambulatory Visit (INDEPENDENT_AMBULATORY_CARE_PROVIDER_SITE_OTHER): Payer: BC Managed Care – PPO | Admitting: Surgery

## 2012-12-14 ENCOUNTER — Ambulatory Visit
Admission: RE | Admit: 2012-12-14 | Discharge: 2012-12-14 | Disposition: A | Discharge status: Home / Self Care | Payer: BC Managed Care – PPO | Source: Ambulatory Visit | Attending: Otolaryngology | Admitting: Otolaryngology

## 2012-12-14 ENCOUNTER — Other Ambulatory Visit: Payer: Self-pay | Admitting: Otolaryngology

## 2012-12-14 VITALS — BP 130/82 | HR 80 | Resp 14 | Ht 72.0 in | Wt 207.2 lb

## 2012-12-14 DIAGNOSIS — J329 Chronic sinusitis, unspecified: Secondary | ICD-10-CM

## 2012-12-14 DIAGNOSIS — R0981 Nasal congestion: Secondary | ICD-10-CM

## 2012-12-14 DIAGNOSIS — L723 Sebaceous cyst: Secondary | ICD-10-CM | POA: Insufficient documentation
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
## 2013-02-05 ENCOUNTER — Encounter (HOSPITAL_BASED_OUTPATIENT_CLINIC_OR_DEPARTMENT_OTHER): Payer: Self-pay

## 2013-02-05 ENCOUNTER — Ambulatory Visit (HOSPITAL_BASED_OUTPATIENT_CLINIC_OR_DEPARTMENT_OTHER): Admit: 2013-02-05 | Payer: Self-pay | Admitting: Otolaryngology

## 2013-02-05 SURGERY — SEPTOPLASTY, NOSE, WITH NASAL TURBINATE REDUCTION
Anesthesia: General | Laterality: Bilateral

## 2013-03-02 ENCOUNTER — Encounter: Payer: Self-pay | Admitting: Neurology

## 2013-03-05 ENCOUNTER — Ambulatory Visit: Payer: BC Managed Care – PPO | Admitting: Neurology

## 2013-04-25 ENCOUNTER — Other Ambulatory Visit: Payer: Self-pay | Admitting: Otolaryngology

## 2013-04-25 DIAGNOSIS — R0981 Nasal congestion: Secondary | ICD-10-CM

## 2013-05-01 ENCOUNTER — Ambulatory Visit
Admission: RE | Admit: 2013-05-01 | Discharge: 2013-05-01 | Disposition: A | Discharge status: Home / Self Care | Payer: BC Managed Care – PPO | Source: Ambulatory Visit | Attending: Otolaryngology | Admitting: Otolaryngology

## 2013-05-01 DIAGNOSIS — R0981 Nasal congestion: Secondary | ICD-10-CM

## 2014-02-04 ENCOUNTER — Other Ambulatory Visit: Payer: Self-pay | Admitting: Orthopedic Surgery

## 2014-02-05 ENCOUNTER — Encounter (HOSPITAL_COMMUNITY): Payer: Self-pay | Admitting: Pharmacy Technician

## 2014-02-05 ENCOUNTER — Encounter (HOSPITAL_COMMUNITY): Payer: Self-pay

## 2014-02-05 NOTE — Pre-Procedure Instructions (Signed)
Brad Fleming  02/05/2014   Your procedure is schedToula Fleming on:  Thursday February 13, 2014 at 1:34 PM.  [name] Admitting at 11:30 AM.  Call this number if you have problems the morning of surgery: [phone]   Remember:   Do not eat food or drink liquids after midnight.   Take these medicines the morning of surgery with A SIP OF WATER: Benadryl if needed, Hydrocodone if needed, Meclizine (Antivert) if needed, Ondansetron (Zofran) if needed for nausea, Paroxetine (Paxil), Pregabalin (Lyrica), Rabeprazole (Aciphex) if needed, Sumatriptan (Imitrex) if needed.   Do not wear jewelry.  Do not wear lotions, powders, or cologne.  Men may shave face and neck.  Do not bring valuables to the hospital.  [facility] is not responsible for any belongings or valuables.               Contacts, dentures or bridgework may not be worn into surgery.  Leave suitcase in the car. After surgery it may be brought to your room.  For patients admitted to the hospital, discharge time is determined by your treatment team.               Patients discharged the day of surgery will not be allowed to drive home.  Name and phone number of your driver: Family/Friend  Special Instructions: Shower using CHG soap the night before and the morning of your surgery   Please read over the following fact sheets that you were given: Pain Booklet, Coughing and Deep Breathing, Blood Transfusion Information, MRSA Information and Surgical Site Infection Prevention

## 2014-02-06 ENCOUNTER — Encounter (HOSPITAL_COMMUNITY)
Admission: RE | Admit: 2014-02-06 | Discharge: 2014-02-06 | Disposition: A | Discharge status: Home / Self Care | Payer: 59 | Source: Ambulatory Visit | Attending: Orthopedic Surgery | Admitting: Orthopedic Surgery

## 2014-02-06 ENCOUNTER — Ambulatory Visit (HOSPITAL_COMMUNITY)
Admission: RE | Admit: 2014-02-06 | Discharge: 2014-02-06 | Disposition: A | Discharge status: Home / Self Care | Payer: 59 | Source: Ambulatory Visit | Attending: Orthopedic Surgery | Admitting: Orthopedic Surgery

## 2014-02-06 ENCOUNTER — Encounter (HOSPITAL_COMMUNITY): Payer: Self-pay

## 2014-02-06 DIAGNOSIS — Z01818 Encounter for other preprocedural examination: Secondary | ICD-10-CM | POA: Insufficient documentation

## 2014-02-06 HISTORY — DX: Insomnia, unspecified: G47.00

## 2014-02-06 HISTORY — DX: Pneumonia, unspecified organism: J18.9

## 2014-02-06 HISTORY — DX: Nausea with vomiting, unspecified: R11.2

## 2014-02-06 HISTORY — DX: Other specified postprocedural states: Z98.890

## 2014-02-06 HISTORY — DX: Bronchitis, not specified as acute or chronic: J40

## 2014-02-06 HISTORY — DX: Anxiety disorder, unspecified: F41.9

## 2014-02-06 HISTORY — DX: Painful micturition, unspecified: R30.9

## 2014-02-06 HISTORY — DX: Dizziness and giddiness: R42

## 2014-02-06 HISTORY — DX: Palpitations: R00.2

## 2014-02-06 HISTORY — DX: Drug induced constipation: K59.03

## 2014-02-06 LAB — CBC WITH DIFFERENTIAL/PLATELET
BASOS PCT: 0 % (ref 0–1)
Basophils Absolute: 0 10*3/uL (ref 0.0–0.1)
Eosinophils Absolute: 0.3 10*3/uL (ref 0.0–0.7)
Eosinophils Relative: 2 % (ref 0–5)
HCT: 47.4 % (ref 39.0–52.0)
HEMOGLOBIN: 16 g/dL (ref 13.0–17.0)
LYMPHS ABS: 2.3 10*3/uL (ref 0.7–4.0)
LYMPHS PCT: 20 % (ref 12–46)
MCH: 29.2 pg (ref 26.0–34.0)
MCHC: 33.8 g/dL (ref 30.0–36.0)
MCV: 86.5 fL (ref 78.0–100.0)
MONOS PCT: 7 % (ref 3–12)
Monocytes Absolute: 0.8 10*3/uL (ref 0.1–1.0)
NEUTROS ABS: 8 10*3/uL — AB (ref 1.7–7.7)
NEUTROS PCT: 71 % (ref 43–77)
Platelets: 216 10*3/uL (ref 150–400)
RBC: 5.48 MIL/uL (ref 4.22–5.81)
RDW: 13.5 % (ref 11.5–15.5)
WBC: 11.4 10*3/uL — AB (ref 4.0–10.5)

## 2014-02-06 LAB — COMPREHENSIVE METABOLIC PANEL
ALBUMIN: 3.9 g/dL (ref 3.5–5.2)
ALK PHOS: 97 U/L (ref 39–117)
ALT: 32 U/L (ref 0–53)
ANION GAP: 12 (ref 5–15)
AST: 29 U/L (ref 0–37)
BILIRUBIN TOTAL: 0.6 mg/dL (ref 0.3–1.2)
BUN: 10 mg/dL (ref 6–23)
CHLORIDE: 100 meq/L (ref 96–112)
CO2: 27 mEq/L (ref 19–32)
Calcium: 9.5 mg/dL (ref 8.4–10.5)
Creatinine, Ser: 0.98 mg/dL (ref 0.50–1.35)
GFR calc Af Amer: >90 mL/min (ref >90)
GFR calc non Af Amer: >90 mL/min (ref >90)
GLUCOSE: 101 mg/dL — AB (ref 70–99)
Potassium: 4.4 mEq/L (ref 3.7–5.3)
Sodium: 139 mEq/L (ref 137–147)
Total Protein: 7.8 g/dL (ref 6.0–8.3)

## 2014-02-06 LAB — URINE MICROSCOPIC-ADD ON

## 2014-02-06 LAB — APTT: APTT: 37 s (ref 24–37)

## 2014-02-06 LAB — SURGICAL PCR SCREEN
MRSA, PCR: POSITIVE — AB
Staphylococcus aureus: POSITIVE — AB

## 2014-02-06 LAB — URINALYSIS, ROUTINE W REFLEX MICROSCOPIC
Glucose, UA: NEGATIVE mg/dL
HGB URINE DIPSTICK: NEGATIVE
Ketones, ur: 15 mg/dL — AB
LEUKOCYTES UA: NEGATIVE
Nitrite: NEGATIVE
Protein, ur: 30 mg/dL — AB
SPECIFIC GRAVITY, URINE: 1.025 (ref 1.005–1.030)
UROBILINOGEN UA: 0.2 mg/dL (ref 0.0–1.0)
pH: 5.5 (ref 5.0–8.0)

## 2014-02-06 LAB — PROTIME-INR
INR: 1.1 (ref 0.00–1.49)
Prothrombin Time: 14.2 seconds (ref 11.6–15.2)

## 2014-02-06 LAB — TYPE AND SCREEN
ABO/RH(D): A POS
ANTIBODY SCREEN: NEGATIVE

## 2014-02-06 LAB — ABO/RH: ABO/RH(D): A POS

## 2014-02-06 NOTE — Progress Notes (Signed)
Patient informed Nurse that he had a stress test within the last year but denied having a cardiac cath or sleep study. Will request records.  PCP is Catha GosselinKevin Little. Patient voiced concerns about having severe migraines and vomiting after awakening from Anesthesia and questioned Nurse about which migraine medication he should take before surgery. Nurse called Revonda StandardAllison, PA and informed her of patients concerns and Lannie Fieldsllison,PA stated she would talk with Dr. Krista BlueSinger about it and call Nurse back. Revonda StandardAllison returned call and instructed Nurse that Dr. Krista BlueSinger stated that patient could take whichever medication he thought would be best for his migraines the DOS. Nurse informed patient of this and patient verbalized understanding.

## 2014-02-06 NOTE — Progress Notes (Signed)
02/06/14 1029  OBSTRUCTIVE SLEEP APNEA  Have you ever been diagnosed with sleep apnea through a sleep study? No  Do you snore loudly (loud enough to be heard through closed doors)?  1  Do you often feel tired, fatigued, or sleepy during the daytime? 0  Has anyone observed you stop breathing during your sleep? 0  Do you have, or are you being treated for high blood pressure? 1  BMI more than 35 kg/m2? 0  Age over 46 years old? 0  Neck circumference greater than 40 cm/16 inches? 1  Gender: 1  Obstructive Sleep Apnea Score 4  Score 4 or greater  Results sent to PCP   This patient has screened at risk for sleep apnea using the STOP bang tool used during a pre-surgical visit. A score of 4 or greater is at risk for sleep apnea.

## 2014-02-06 NOTE — Progress Notes (Signed)
Patient informed Nurse that he was currently using Bactroban ointment in his left nostril which was prescribed by this physician on yesterday. Therefore PCR swab sample was collected from patients right nostril only.

## 2014-02-06 NOTE — Progress Notes (Signed)
Nurse called patient and left a voicemail and instructed patient to use Bactroban ointment in both nostrils twice a day for the next five days. Direct call back number included in voicemail. Nurse did not call in prescription for Bactroban as patient had ointment during PAT appointment.

## 2014-02-12 MED ORDER — CEFAZOLIN SODIUM-DEXTROSE 2-3 GM-% IV SOLR
2.0000 g | INTRAVENOUS | Status: AC
  Administered 2014-02-13: 2 g via INTRAVENOUS
  Filled 2014-02-12: qty 50

## 2014-02-12 NOTE — H&P (Signed)
PREOPERATIVE H&P  Chief Complaint: bilateral arm pain and hand tingling  HPI: Brad Fleming is a 48 y.o. 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 Colitis, ulcerative   . Chronic back pain   . H/O: GI bleed   . Hx of migraines   . Colitis     History of  . History of acute bronchitis   . History of ulcerative colitis   . Sinusitis   . Depression   . GERD (gastroesophageal reflux disease)   . Hyperlipidemia   . Depression   . Migraine   . PONV (postoperative nausea and vomiting)     causes severe migraine and severe nausea and vomiting  . Palpitations     Had stress test but patient stated it was normal and related palpitations to caffiene  . Bronchitis   . Pneumonia     hx of  . Anxiety   . Painful urination     Painful at times, pt thinks its due to his medications  . Constipation due to pain medication   . Vertigo   . Insomnia    Past Surgical History  Procedure Laterality Date  . Hernia repair    . Knee surgery    . Umbilical hernia repair    . Right thumb surgery    . Right shoulder arthroscopy    . Hand surgery Right     X 2  . Forearm surgery Right   . Nasal sinus surgery      X 2   History   Social History  . Marital Status: Married    Spouse Name: N/A    Number of Children: N/A  . Years of Education: N/A   Social History Main Topics  . Smoking status: Former Smoker    Types: Cigarettes  . Smokeless tobacco: Never Used  . Alcohol Use: No  . Drug Use: No  . Sexual Activity: Not Currently   Other Topics Concern  . Not on file   Social History Narrative  . No narrative on file   Family History  Problem Relation Age of Onset  . Cancer - Prostate Father     . COPD Mother   . Alcoholism Brother    Allergies  Allergen Reactions  . Nsaids Other (See Comments)    Intestinal bleeding Patient states he does not have a problem with small dosages  . Opana [Oxymorphone Hcl] Other (See Comments)    intestional spasms  . Peanut-Containing Drug Products Nausea And Vomiting and Other (See Comments)    Peanuts cause severe migraines  . Midazolam Hcl Other (See Comments)    migraines   Prior to Admission medications   Medication Sig Start Date End Date Taking? Authorizing Provider  azelastine (ASTELIN) 0.1 % nasal spray Place 1 spray into both nostrils daily as needed for rhinitis. Use in each nostril as directed   Yes Historical Provider, MD  butalbital-acetaminophen-caffeine (FIORICET, ESGIC) 50-325-40 MG per tablet Take 1-2 tablets by mouth every 4 (four) hours as needed for migraine.    Yes Historical Provider, MD  diphenhydrAMINE (BENADRYL) 25 mg capsule Take 50-75 mg by mouth 3 (three) times daily as needed for allergies.    Yes  Historical Provider, MD  HYDROcodone Bitartrate ER (ZOHYDRO ER) 30 MG CP12 Take 30 mg by mouth 2 (two) times daily.   Yes Historical Provider, MD  HYDROcodone-acetaminophen (NORCO) 10-325 MG per tablet Take 1 tablet by mouth 2 (two) times daily as needed for moderate pain.   Yes Historical Provider, MD  meclizine (ANTIVERT) 25 MG tablet Take 25 mg by mouth 3 (three) times daily as needed for dizziness.   Yes Historical Provider, MD  methocarbamol (ROBAXIN) 750 MG tablet Take 750 mg by mouth 3 (three) times daily as needed (muscle spasms).    Yes Historical Provider, MD  ondansetron (ZOFRAN) 4 MG tablet Take 4-8 mg by mouth every 4 (four) hours as needed for nausea.    Yes Historical Provider, MD  oxymetazoline (ANEFRIN NASAL SPRAY) 0.05 % nasal spray Place 1 spray into both nostrils daily as needed for congestion.   Yes Historical Provider, MD  pregabalin (LYRICA) 225 MG capsule Take 225 mg by mouth 2 (two) times daily.    Yes  Historical Provider, MD  RABEprazole (ACIPHEX) 20 MG tablet Take 20 mg by mouth daily as needed (acid reflux).    Yes Historical Provider, MD  SUMAtriptan (IMITREX) 6 MG/0.5ML SOLN injection Inject 6 mg into the skin every 2 (two) hours as needed for migraine or headache.    Yes Historical Provider, MD  traZODone (DESYREL) 100 MG tablet Take 100 mg by mouth at bedtime as needed for sleep.   Yes Historical Provider, MD  cephALEXin (KEFLEX) 500 MG capsule Take 500 mg by mouth 2 (two) times daily. Take 2 tablets by mouth twice a day    Historical Provider, MD  mupirocin ointment (BACTROBAN) 2 % Place 1 application into the nose 3 (three) times daily. Apply three times daily for 10 days to left nostril    Historical Provider, MD     All other systems have been reviewed and were otherwise negative with the exception of those mentioned in the HPI and as above.  Physical Exam: There were no vitals filed for this visit.  General: Alert, no acute distress Cardiovascular: No pedal edema Respiratory: No cyanosis, no use of accessory musculature Skin: No lesions in the area of chief complaint Neurologic: Sensation intact distally Psychiatric: Patient is competent for consent with normal mood and affect Lymphatic: No axillary or cervical lymphadenopathy  MUSCULOSKELETAL: + hoffman's on right  Assessment/Plan: Myelopathy, cervical readic Plan for Procedure(s): ANTERIOR CERVICAL DECOMPRESSION/DISCECTOMY FUSION 2 LEVELS   Emilee Hero, MD 02/12/2014 3:55 PM

## 2014-02-13 ENCOUNTER — Ambulatory Visit (HOSPITAL_COMMUNITY)
Admission: RE | Admit: 2014-02-13 | Discharge: 2014-02-14 | Disposition: A | Discharge status: Home / Self Care | Payer: 59 | Source: Ambulatory Visit | Attending: Orthopedic Surgery | Admitting: Orthopedic Surgery

## 2014-02-13 ENCOUNTER — Ambulatory Visit (HOSPITAL_COMMUNITY): Payer: 59

## 2014-02-13 ENCOUNTER — Encounter (HOSPITAL_COMMUNITY)
Admission: RE | Disposition: A | Discharge status: Home / Self Care | Payer: Self-pay | Source: Ambulatory Visit | Attending: Orthopedic Surgery

## 2014-02-13 ENCOUNTER — Ambulatory Visit (HOSPITAL_COMMUNITY): Payer: 59 | Admitting: Anesthesiology

## 2014-02-13 ENCOUNTER — Encounter (HOSPITAL_COMMUNITY): Payer: 59 | Admitting: Vascular Surgery

## 2014-02-13 ENCOUNTER — Encounter (HOSPITAL_COMMUNITY): Payer: Self-pay | Admitting: *Deleted

## 2014-02-13 DIAGNOSIS — M5000 Cervical disc disorder with myelopathy, unspecified cervical region: Secondary | ICD-10-CM | POA: Insufficient documentation

## 2014-02-13 DIAGNOSIS — F329 Major depressive disorder, single episode, unspecified: Secondary | ICD-10-CM | POA: Insufficient documentation

## 2014-02-13 DIAGNOSIS — E785 Hyperlipidemia, unspecified: Secondary | ICD-10-CM | POA: Insufficient documentation

## 2014-02-13 DIAGNOSIS — Z87891 Personal history of nicotine dependence: Secondary | ICD-10-CM | POA: Insufficient documentation

## 2014-02-13 DIAGNOSIS — G43909 Migraine, unspecified, not intractable, without status migrainosus: Secondary | ICD-10-CM | POA: Insufficient documentation

## 2014-02-13 DIAGNOSIS — M4802 Spinal stenosis, cervical region: Secondary | ICD-10-CM | POA: Insufficient documentation

## 2014-02-13 DIAGNOSIS — M5030 Other cervical disc degeneration, unspecified cervical region: Secondary | ICD-10-CM | POA: Diagnosis present

## 2014-02-13 DIAGNOSIS — F3289 Other specified depressive episodes: Secondary | ICD-10-CM | POA: Diagnosis not present

## 2014-02-13 DIAGNOSIS — G549 Nerve root and plexus disorder, unspecified: Secondary | ICD-10-CM | POA: Diagnosis present

## 2014-02-13 DIAGNOSIS — K5190 Ulcerative colitis, unspecified, without complications: Secondary | ICD-10-CM | POA: Insufficient documentation

## 2014-02-13 DIAGNOSIS — J329 Chronic sinusitis, unspecified: Secondary | ICD-10-CM | POA: Insufficient documentation

## 2014-02-13 DIAGNOSIS — Z792 Long term (current) use of antibiotics: Secondary | ICD-10-CM | POA: Diagnosis not present

## 2014-02-13 DIAGNOSIS — K219 Gastro-esophageal reflux disease without esophagitis: Secondary | ICD-10-CM | POA: Diagnosis not present

## 2014-02-13 HISTORY — PX: ANTERIOR CERVICAL DECOMP/DISCECTOMY FUSION: SHX1161

## 2014-02-13 SURGERY — ANTERIOR CERVICAL DECOMPRESSION/DISCECTOMY FUSION 2 LEVELS
Anesthesia: General

## 2014-02-13 MED ORDER — SCOPOLAMINE 1 MG/3DAYS TD PT72
1.0000 | MEDICATED_PATCH | TRANSDERMAL | Status: DC
  Administered 2014-02-13: 1 via TRANSDERMAL

## 2014-02-13 MED ORDER — TRAZODONE HCL 100 MG PO TABS
100.0000 mg | ORAL_TABLET | Freq: Every evening | ORAL | Status: DC | PRN
  Filled 2014-02-13: qty 1

## 2014-02-13 MED ORDER — HYDROMORPHONE HCL PF 1 MG/ML IJ SOLN
1.0000 mg | INTRAMUSCULAR | Status: DC | PRN
  Administered 2014-02-13 – 2014-02-14 (×3): 1 mg via INTRAVENOUS
  Filled 2014-02-13 (×3): qty 1

## 2014-02-13 MED ORDER — BUPIVACAINE-EPINEPHRINE 0.25% -1:200000 IJ SOLN
INTRAMUSCULAR | Status: DC | PRN
  Administered 2014-02-13: 2 mL

## 2014-02-13 MED ORDER — SODIUM CHLORIDE 0.9 % IJ SOLN: 3.0000 mL | INTRAMUSCULAR | Status: DC | PRN

## 2014-02-13 MED ORDER — ACETAMINOPHEN 650 MG RE SUPP: 650.0000 mg | RECTAL | Status: DC | PRN

## 2014-02-13 MED ORDER — OXYCODONE-ACETAMINOPHEN 5-325 MG PO TABS
1.0000 | ORAL_TABLET | ORAL | Status: DC | PRN
  Administered 2014-02-13 – 2014-02-14 (×4): 2 via ORAL
  Filled 2014-02-13 (×3): qty 2

## 2014-02-13 MED ORDER — DIPHENHYDRAMINE HCL 25 MG PO CAPS: 50.0000 mg | ORAL_CAPSULE | Freq: Three times a day (TID) | ORAL | Status: DC | PRN

## 2014-02-13 MED ORDER — PROPOFOL 10 MG/ML IV BOLUS
INTRAVENOUS | Status: DC | PRN
  Administered 2014-02-13: 150 mg via INTRAVENOUS

## 2014-02-13 MED ORDER — FENTANYL CITRATE 0.05 MG/ML IJ SOLN
25.0000 ug | INTRAMUSCULAR | Status: DC | PRN
  Administered 2014-02-13 (×2): 50 ug via INTRAVENOUS

## 2014-02-13 MED ORDER — GLYCOPYRROLATE 0.2 MG/ML IJ SOLN
INTRAMUSCULAR | Status: DC | PRN
  Administered 2014-02-13: .8 mg via INTRAVENOUS

## 2014-02-13 MED ORDER — LIDOCAINE HCL (CARDIAC) 20 MG/ML IV SOLN
INTRAVENOUS | Status: DC | PRN
  Administered 2014-02-13: 50 mg via INTRAVENOUS

## 2014-02-13 MED ORDER — CHLORHEXIDINE GLUCONATE CLOTH 2 % EX PADS
6.0000 | MEDICATED_PAD | Freq: Every day | CUTANEOUS | Status: DC
  Administered 2014-02-14: 6 via TOPICAL

## 2014-02-13 MED ORDER — ONDANSETRON HCL 4 MG/2ML IJ SOLN
INTRAMUSCULAR | Status: AC
  Filled 2014-02-13: qty 2

## 2014-02-13 MED ORDER — ROCURONIUM BROMIDE 50 MG/5ML IV SOLN
INTRAVENOUS | Status: AC
  Filled 2014-02-13: qty 1

## 2014-02-13 MED ORDER — SCOPOLAMINE 1 MG/3DAYS TD PT72
MEDICATED_PATCH | TRANSDERMAL | Status: AC
  Filled 2014-02-13: qty 1

## 2014-02-13 MED ORDER — PROMETHAZINE HCL 25 MG/ML IJ SOLN: 6.2500 mg | INTRAMUSCULAR | Status: DC | PRN

## 2014-02-13 MED ORDER — MUPIROCIN 2 % EX OINT
1.0000 "application " | TOPICAL_OINTMENT | Freq: Three times a day (TID) | CUTANEOUS | Status: DC
  Administered 2014-02-13 – 2014-02-14 (×2): 1 via NASAL
  Filled 2014-02-13: qty 22

## 2014-02-13 MED ORDER — ACETAMINOPHEN 10 MG/ML IV SOLN
1000.0000 mg | INTRAVENOUS | Status: DC
  Filled 2014-02-13: qty 100

## 2014-02-13 MED ORDER — MEPERIDINE HCL 25 MG/ML IJ SOLN: 6.2500 mg | INTRAMUSCULAR | Status: DC | PRN

## 2014-02-13 MED ORDER — LACTATED RINGERS IV SOLN
INTRAVENOUS | Status: DC
  Administered 2014-02-13 (×3): via INTRAVENOUS

## 2014-02-13 MED ORDER — ONDANSETRON HCL 4 MG/2ML IJ SOLN
INTRAMUSCULAR | Status: DC | PRN
  Administered 2014-02-13: 4 mg via INTRAVENOUS

## 2014-02-13 MED ORDER — PROPOFOL INFUSION 10 MG/ML OPTIME
INTRAVENOUS | Status: DC | PRN
  Administered 2014-02-13: 100 ug/kg/min via INTRAVENOUS

## 2014-02-13 MED ORDER — FENTANYL CITRATE 0.05 MG/ML IJ SOLN: 25.0000 ug | INTRAMUSCULAR | Status: DC | PRN

## 2014-02-13 MED ORDER — AZELASTINE HCL 0.1 % NA SOLN: 1.0000 | Freq: Every day | NASAL | Status: DC | PRN

## 2014-02-13 MED ORDER — ESMOLOL HCL 10 MG/ML IV SOLN
INTRAVENOUS | Status: AC
Start: 2014-02-13 — End: 2014-02-13
  Filled 2014-02-13: qty 10

## 2014-02-13 MED ORDER — MECLIZINE HCL 25 MG PO TABS
25.0000 mg | ORAL_TABLET | Freq: Three times a day (TID) | ORAL | Status: DC | PRN
  Filled 2014-02-13: qty 1

## 2014-02-13 MED ORDER — PREGABALIN 25 MG PO CAPS
225.0000 mg | ORAL_CAPSULE | Freq: Two times a day (BID) | ORAL | Status: DC
  Administered 2014-02-13 – 2014-02-14 (×2): 225 mg via ORAL
  Filled 2014-02-13: qty 1
  Filled 2014-02-13: qty 2
  Filled 2014-02-13: qty 1
  Filled 2014-02-13: qty 2

## 2014-02-13 MED ORDER — PROPOFOL 10 MG/ML IV BOLUS
INTRAVENOUS | Status: AC
  Filled 2014-02-13: qty 20

## 2014-02-13 MED ORDER — DOCUSATE SODIUM 100 MG PO CAPS
100.0000 mg | ORAL_CAPSULE | Freq: Two times a day (BID) | ORAL | Status: DC
  Administered 2014-02-13 – 2014-02-14 (×2): 100 mg via ORAL
  Filled 2014-02-13 (×3): qty 1

## 2014-02-13 MED ORDER — ARTIFICIAL TEARS OP OINT
TOPICAL_OINTMENT | OPHTHALMIC | Status: DC | PRN
  Administered 2014-02-13: 1 via OPHTHALMIC

## 2014-02-13 MED ORDER — SENNOSIDES-DOCUSATE SODIUM 8.6-50 MG PO TABS
1.0000 | ORAL_TABLET | Freq: Every evening | ORAL | Status: DC | PRN
  Filled 2014-02-13: qty 1

## 2014-02-13 MED ORDER — ONDANSETRON HCL 4 MG/2ML IJ SOLN: 4.0000 mg | INTRAMUSCULAR | Status: DC | PRN

## 2014-02-13 MED ORDER — PHENOL 1.4 % MT LIQD: 1.0000 | OROMUCOSAL | Status: DC | PRN

## 2014-02-13 MED ORDER — THROMBIN 20000 UNITS EX SOLR
CUTANEOUS | Status: AC
  Filled 2014-02-13: qty 20000

## 2014-02-13 MED ORDER — DIAZEPAM 5 MG PO TABS
5.0000 mg | ORAL_TABLET | Freq: Four times a day (QID) | ORAL | Status: DC | PRN
  Administered 2014-02-13 – 2014-02-14 (×3): 5 mg via ORAL
  Filled 2014-02-13 (×3): qty 1

## 2014-02-13 MED ORDER — FENTANYL CITRATE 0.05 MG/ML IJ SOLN
INTRAMUSCULAR | Status: AC
  Filled 2014-02-13: qty 2

## 2014-02-13 MED ORDER — THROMBIN 20000 UNITS EX SOLR
CUTANEOUS | Status: DC | PRN
  Administered 2014-02-13: 14:00:00 via TOPICAL

## 2014-02-13 MED ORDER — FLEET ENEMA 7-19 GM/118ML RE ENEM
1.0000 | ENEMA | Freq: Once | RECTAL | Status: AC | PRN
  Filled 2014-02-13: qty 1

## 2014-02-13 MED ORDER — SULFAMETHOXAZOLE-TMP DS 800-160 MG PO TABS
1.0000 | ORAL_TABLET | Freq: Two times a day (BID) | ORAL | Status: DC
  Administered 2014-02-13 – 2014-02-14 (×2): 1 via ORAL
  Filled 2014-02-13 (×3): qty 1

## 2014-02-13 MED ORDER — ALUM & MAG HYDROXIDE-SIMETH 200-200-20 MG/5ML PO SUSP: 30.0000 mL | Freq: Four times a day (QID) | ORAL | Status: DC | PRN

## 2014-02-13 MED ORDER — BISACODYL 5 MG PO TBEC
5.0000 mg | DELAYED_RELEASE_TABLET | Freq: Every day | ORAL | Status: DC | PRN
  Filled 2014-02-13: qty 1

## 2014-02-13 MED ORDER — OXYCODONE-ACETAMINOPHEN 5-325 MG PO TABS
ORAL_TABLET | ORAL | Status: AC
Start: 2014-02-13 — End: 2014-02-14
  Filled 2014-02-13: qty 2

## 2014-02-13 MED ORDER — BUPIVACAINE-EPINEPHRINE (PF) 0.25% -1:200000 IJ SOLN
INTRAMUSCULAR | Status: AC
  Filled 2014-02-13: qty 30

## 2014-02-13 MED ORDER — SODIUM CHLORIDE 0.9 % IJ SOLN
INTRAMUSCULAR | Status: AC
  Filled 2014-02-13: qty 10

## 2014-02-13 MED ORDER — CEFAZOLIN SODIUM 1-5 GM-% IV SOLN
1.0000 g | Freq: Three times a day (TID) | INTRAVENOUS | Status: AC
  Administered 2014-02-13 – 2014-02-14 (×2): 1 g via INTRAVENOUS
  Filled 2014-02-13 (×2): qty 50

## 2014-02-13 MED ORDER — EPHEDRINE SULFATE 50 MG/ML IJ SOLN
INTRAMUSCULAR | Status: AC
  Filled 2014-02-13: qty 1

## 2014-02-13 MED ORDER — SODIUM CHLORIDE 0.9 % IJ SOLN: 3.0000 mL | Freq: Two times a day (BID) | INTRAMUSCULAR | Status: DC

## 2014-02-13 MED ORDER — ACETAMINOPHEN 10 MG/ML IV SOLN
INTRAVENOUS | Status: DC | PRN
  Administered 2014-02-13: 1000 mg via INTRAVENOUS

## 2014-02-13 MED ORDER — BUTALBITAL-APAP-CAFFEINE 50-325-40 MG PO TABS: 1.0000 | ORAL_TABLET | ORAL | Status: DC | PRN

## 2014-02-13 MED ORDER — NEOSTIGMINE METHYLSULFATE 10 MG/10ML IV SOLN
INTRAVENOUS | Status: DC | PRN
  Administered 2014-02-13: 5 mg via INTRAVENOUS

## 2014-02-13 MED ORDER — MENTHOL 3 MG MT LOZG
1.0000 | LOZENGE | OROMUCOSAL | Status: DC | PRN
  Filled 2014-02-13: qty 9

## 2014-02-13 MED ORDER — MORPHINE SULFATE 2 MG/ML IJ SOLN: 1.0000 mg | INTRAMUSCULAR | Status: DC | PRN

## 2014-02-13 MED ORDER — PANTOPRAZOLE SODIUM 40 MG PO TBEC
40.0000 mg | DELAYED_RELEASE_TABLET | Freq: Every day | ORAL | Status: DC
  Administered 2014-02-14: 40 mg via ORAL
  Filled 2014-02-13: qty 1

## 2014-02-13 MED ORDER — CEPHALEXIN 500 MG PO CAPS
500.0000 mg | ORAL_CAPSULE | Freq: Two times a day (BID) | ORAL | Status: DC
  Filled 2014-02-13: qty 1

## 2014-02-13 MED ORDER — FENTANYL CITRATE 0.05 MG/ML IJ SOLN
INTRAMUSCULAR | Status: DC | PRN
  Administered 2014-02-13: 50 ug via INTRAVENOUS
  Administered 2014-02-13: 125 ug via INTRAVENOUS
  Administered 2014-02-13: 75 ug via INTRAVENOUS

## 2014-02-13 MED ORDER — SUMATRIPTAN SUCCINATE 6 MG/0.5ML ~~LOC~~ SOLN
6.0000 mg | SUBCUTANEOUS | Status: DC | PRN
  Filled 2014-02-13: qty 0.5

## 2014-02-13 MED ORDER — LIDOCAINE HCL (CARDIAC) 20 MG/ML IV SOLN
INTRAVENOUS | Status: AC
  Filled 2014-02-13: qty 5

## 2014-02-13 MED ORDER — SUCCINYLCHOLINE CHLORIDE 20 MG/ML IJ SOLN
INTRAMUSCULAR | Status: AC
  Filled 2014-02-13: qty 1

## 2014-02-13 MED ORDER — ROCURONIUM BROMIDE 100 MG/10ML IV SOLN
INTRAVENOUS | Status: DC | PRN
  Administered 2014-02-13: 20 mg via INTRAVENOUS
  Administered 2014-02-13: 30 mg via INTRAVENOUS

## 2014-02-13 MED ORDER — ACETAMINOPHEN 10 MG/ML IV SOLN: 1000.0000 mg | Freq: Four times a day (QID) | INTRAVENOUS | Status: DC

## 2014-02-13 MED ORDER — OXYMETAZOLINE HCL 0.05 % NA SOLN: 1.0000 | Freq: Every day | NASAL | Status: DC | PRN

## 2014-02-13 MED ORDER — ONDANSETRON HCL 4 MG PO TABS
4.0000 mg | ORAL_TABLET | ORAL | Status: DC | PRN
  Administered 2014-02-13: 8 mg via ORAL
  Filled 2014-02-13: qty 2

## 2014-02-13 MED ORDER — SUCCINYLCHOLINE CHLORIDE 20 MG/ML IJ SOLN
INTRAMUSCULAR | Status: DC | PRN
  Administered 2014-02-13: 120 mg via INTRAVENOUS

## 2014-02-13 MED ORDER — FENTANYL CITRATE 0.05 MG/ML IJ SOLN
INTRAMUSCULAR | Status: AC
  Filled 2014-02-13: qty 5

## 2014-02-13 MED ORDER — ACETAMINOPHEN 325 MG PO TABS
650.0000 mg | ORAL_TABLET | ORAL | Status: DC | PRN
Start: 2014-02-13 — End: 2014-02-14

## 2014-02-13 SURGICAL SUPPLY — 78 items
APL SKNCLS STERI-STRIP NONHPOA (GAUZE/BANDAGES/DRESSINGS) ×1
BENZOIN TINCTURE PRP APPL 2/3 (GAUZE/BANDAGES/DRESSINGS) ×3 IMPLANT
BIT DRILL NEURO 2X3.1 SFT TUCH (MISCELLANEOUS) ×1 IMPLANT
BIT DRILL SRG 14X2.2XFLT CHK (BIT) IMPLANT
BIT DRL SRG 14X2.2XFLT CHK (BIT) ×1
BLADE SURG 15 STRL LF DISP TIS (BLADE) ×1 IMPLANT
BLADE SURG 15 STRL SS (BLADE) ×3
BLADE SURG ROTATE 9660 (MISCELLANEOUS) ×3 IMPLANT
BUR MATCHSTICK NEURO 3.0 LAGG (BURR) IMPLANT
CARTRIDGE OIL MAESTRO DRILL (MISCELLANEOUS) ×1 IMPLANT
CLOSURE STERI-STRIP 1/2X4 (GAUZE/BANDAGES/DRESSINGS) ×1
CLOSURE WOUND 1/2 X4 (GAUZE/BANDAGES/DRESSINGS) ×1
CLSR STERI-STRIP ANTIMIC 1/2X4 (GAUZE/BANDAGES/DRESSINGS) ×1 IMPLANT
COLLAR CERV LO CONTOUR FIRM DE (SOFTGOODS) IMPLANT
CORDS BIPOLAR (ELECTRODE) ×3 IMPLANT
COVER SURGICAL LIGHT HANDLE (MISCELLANEOUS) ×3 IMPLANT
CRADLE DONUT ADULT HEAD (MISCELLANEOUS) ×3 IMPLANT
DIFFUSER DRILL AIR PNEUMATIC (MISCELLANEOUS) ×3 IMPLANT
DRAIN JACKSON RD 7FR 3/32 (WOUND CARE) IMPLANT
DRAPE C-ARM 42X72 X-RAY (DRAPES) ×3 IMPLANT
DRAPE POUCH INSTRU U-SHP 10X18 (DRAPES) ×3 IMPLANT
DRAPE SURG 17X23 STRL (DRAPES) ×9 IMPLANT
DRILL BIT SKYLINE 14MM (BIT) ×3
DRILL NEURO 2X3.1 SOFT TOUCH (MISCELLANEOUS) ×3
DURAPREP 26ML APPLICATOR (WOUND CARE) ×3 IMPLANT
ELECT COATED BLADE 2.86 ST (ELECTRODE) ×3 IMPLANT
ELECT REM PT RETURN 9FT ADLT (ELECTROSURGICAL) ×3
ELECTRODE REM PT RTRN 9FT ADLT (ELECTROSURGICAL) ×1 IMPLANT
EVACUATOR SILICONE 100CC (DRAIN) IMPLANT
GAUZE SPONGE 4X4 12PLY STRL (GAUZE/BANDAGES/DRESSINGS) ×3 IMPLANT
GAUZE SPONGE 4X4 16PLY XRAY LF (GAUZE/BANDAGES/DRESSINGS) ×3 IMPLANT
GLOVE BIO SURGEON STRL SZ7 (GLOVE) ×3 IMPLANT
GLOVE BIO SURGEON STRL SZ8 (GLOVE) ×3 IMPLANT
GLOVE BIOGEL PI IND STRL 7.5 (GLOVE) ×2 IMPLANT
GLOVE BIOGEL PI IND STRL 8 (GLOVE) ×1 IMPLANT
GLOVE BIOGEL PI INDICATOR 7.5 (GLOVE) ×4
GLOVE BIOGEL PI INDICATOR 8 (GLOVE) ×2
GOWN STRL REUS W/ TWL LRG LVL3 (GOWN DISPOSABLE) ×1 IMPLANT
GOWN STRL REUS W/ TWL XL LVL3 (GOWN DISPOSABLE) ×1 IMPLANT
GOWN STRL REUS W/TWL LRG LVL3 (GOWN DISPOSABLE) ×3
GOWN STRL REUS W/TWL XL LVL3 (GOWN DISPOSABLE) ×3
IMPL S ENDOSKEL TC 8MM ODEG (Orthopedic Implant) IMPLANT
IMPLANT S ENDOSKEL TC 8MM ODEG (Orthopedic Implant) ×6 IMPLANT
IV CATH 14GX2 1/4 (CATHETERS) ×3 IMPLANT
KIT BASIN OR (CUSTOM PROCEDURE TRAY) ×3 IMPLANT
KIT ROOM TURNOVER OR (KITS) ×3 IMPLANT
MANIFOLD NEPTUNE II (INSTRUMENTS) ×3 IMPLANT
NDL SPNL 20GX3.5 QUINCKE YW (NEEDLE) ×1 IMPLANT
NEEDLE 27GAX1X1/2 (NEEDLE) ×3 IMPLANT
NEEDLE SPNL 20GX3.5 QUINCKE YW (NEEDLE) ×3 IMPLANT
NS IRRIG 1000ML POUR BTL (IV SOLUTION) ×3 IMPLANT
OIL CARTRIDGE MAESTRO DRILL (MISCELLANEOUS) ×3
PACK ORTHO CERVICAL (CUSTOM PROCEDURE TRAY) ×3 IMPLANT
PAD ARMBOARD 7.5X6 YLW CONV (MISCELLANEOUS) ×6 IMPLANT
PATTIES SURGICAL .5 X.5 (GAUZE/BANDAGES/DRESSINGS) IMPLANT
PATTIES SURGICAL .5 X1 (DISPOSABLE) IMPLANT
PIN DISTRACTION 14 (PIN) ×2 IMPLANT
PLATE SKYLINE TWO LEVEL 32MM (Plate) ×2 IMPLANT
PUTTY BONE DBX 2.5 MIS (Bone Implant) ×2 IMPLANT
SCREW VAR SELF TAP SKYLINE 14M (Screw) ×12 IMPLANT
SPONGE GAUZE 4X4 12PLY STER LF (GAUZE/BANDAGES/DRESSINGS) ×2 IMPLANT
SPONGE INTESTINAL PEANUT (DISPOSABLE) ×3 IMPLANT
SPONGE SURGIFOAM ABS GEL 100 (HEMOSTASIS) ×3 IMPLANT
STRIP CLOSURE SKIN 1/2X4 (GAUZE/BANDAGES/DRESSINGS) ×2 IMPLANT
SURGIFLO TRUKIT (HEMOSTASIS) IMPLANT
SUT MNCRL AB 4-0 PS2 18 (SUTURE) ×3 IMPLANT
SUT SILK 4 0 (SUTURE)
SUT SILK 4-0 18XBRD TIE 12 (SUTURE) IMPLANT
SUT VIC AB 2-0 CT2 18 VCP726D (SUTURE) ×3 IMPLANT
SYR BULB IRRIGATION 50ML (SYRINGE) ×3 IMPLANT
SYR CONTROL 10ML LL (SYRINGE) ×6 IMPLANT
TAPE CLOTH 4X10 WHT NS (GAUZE/BANDAGES/DRESSINGS) ×3 IMPLANT
TAPE CLOTH SURG 4X10 WHT LF (GAUZE/BANDAGES/DRESSINGS) ×2 IMPLANT
TAPE UMBILICAL COTTON 1/8X30 (MISCELLANEOUS) ×3 IMPLANT
TOWEL OR 17X24 6PK STRL BLUE (TOWEL DISPOSABLE) ×3 IMPLANT
TOWEL OR 17X26 10 PK STRL BLUE (TOWEL DISPOSABLE) ×3 IMPLANT
WATER STERILE IRR 1000ML POUR (IV SOLUTION) ×3 IMPLANT
YANKAUER SUCT BULB TIP NO VENT (SUCTIONS) ×3 IMPLANT

## 2014-02-13 NOTE — Anesthesia Postprocedure Evaluation (Signed)
  Anesthesia Post-op Note  Patient: Brad Fleming  Procedure(s) Performed: Procedure(s) with comments: ANTERIOR CERVICAL DECOMPRESSION/DISCECTOMY FUSION 2 LEVELS (N/A) - Anterior cervical decompression fusion, cervical 3-4, cervical 4-5 with instrumentation and allograft  Patient Location: PACU  Anesthesia Type:General  Level of Consciousness: alert , oriented and patient cooperative  Airway and Oxygen Therapy: Patient Spontanous Breathing and Patient connected to nasal cannula oxygen  Post-op Pain: mild  Post-op Assessment: Patient's Cardiovascular Status Stable, Respiratory Function Stable, Patent Airway and No signs of Nausea or vomiting  Post-op Vital Signs: Reviewed and stable  Last Vitals:  Filed Vitals:   02/13/14 1645  BP: 149/95  Pulse: 88  Temp:   Resp: 17    Complications: No apparent anesthesia complications

## 2014-02-13 NOTE — Transfer of Care (Signed)
Immediate Anesthesia Transfer of Care Note  Patient: ZYGMUNT MCGLINN  Procedure(s) Performed: Procedure(s) with comments: ANTERIOR CERVICAL DECOMPRESSION/DISCECTOMY FUSION 2 LEVELS (N/A) - Anterior cervical decompression fusion, cervical 3-4, cervical 4-5 with instrumentation and allograft  Patient Location: PACU  Anesthesia Type:General  Level of Consciousness: awake, alert  and oriented  Airway & Oxygen Therapy: Patient Spontanous Breathing and Patient connected to nasal cannula oxygen  Post-op Assessment: Report given to PACU RN, Post -op Vital signs reviewed and stable and Patient moving all extremities X 4  Post vital signs: Reviewed and stable  Complications: No apparent anesthesia complications

## 2014-02-13 NOTE — Plan of Care (Signed)
Problem: Consults Goal: Diagnosis - Spinal Surgery Outcome: Completed/Met Date Met:  02/13/14 Cervical Spine Fusion

## 2014-02-13 NOTE — Anesthesia Preprocedure Evaluation (Addendum)
Anesthesia Evaluation  Patient identified by MRN, date of birth, ID band Patient awake    Reviewed: Allergy & Precautions, H&P , NPO status , Patient's Chart, lab work & pertinent test results  History of Anesthesia Complications (+) PONV  Airway Mallampati: II TM Distance: >3 FB     Dental  (+) Missing, Teeth Intact, Dental Advisory Given   Pulmonary former smoker,  breath sounds clear to auscultation        Cardiovascular Rhythm:Regular Rate:Normal     Neuro/Psych  Headaches, Anxiety Depression    GI/Hepatic GERD-  Medicated,  Endo/Other    Renal/GU      Musculoskeletal   Abdominal (+)  Abdomen: soft.    Peds  Hematology   Anesthesia Other Findings   Reproductive/Obstetrics                        Anesthesia Physical Anesthesia Plan  ASA: III  Anesthesia Plan: General   Post-op Pain Management:    Induction:   Airway Management Planned: Oral ETT  Additional Equipment:   Intra-op Plan:   Post-operative Plan: Extubation in OR  Informed Consent: I have reviewed the patients History and Physical, chart, labs and discussed the procedure including the risks, benefits and alternatives for the proposed anesthesia with the patient or authorized representative who has indicated his/her understanding and acceptance.     Plan Discussed with:   Anesthesia Plan Comments:         Anesthesia Quick Evaluation

## 2014-02-13 NOTE — Anesthesia Procedure Notes (Signed)
Procedure Name: Intubation Date/Time: 02/13/2014 1:32 PM Performed by: Jefm Miles E Pre-anesthesia Checklist: Patient identified, Emergency Drugs available, Suction available, Patient being monitored and Timeout performed Patient Re-evaluated:Patient Re-evaluated prior to inductionOxygen Delivery Method: Circle system utilized Preoxygenation: Pre-oxygenation with 100% oxygen Intubation Type: IV induction and Rapid sequence Ventilation: Two handed mask ventilation required and Mask ventilation with difficulty Laryngoscope Size: Mac and 3 Grade View: Grade I Tube type: Oral Tube size: 7.5 mm Number of attempts: 1 Airway Equipment and Method: Stylet and Video-laryngoscopy Placement Confirmation: ETT inserted through vocal cords under direct vision,  positive ETCO2 and breath sounds checked- equal and bilateral Secured at: 22 cm Tube secured with: Tape Dental Injury: Teeth and Oropharynx as per pre-operative assessment  Difficulty Due To: Difficulty was anticipated, Difficult Airway- due to limited oral opening, Difficult Airway-  due to neck instability and Difficult Airway- due to reduced neck mobility Comments: Elective use of Glide Scope due to patient reports of pain in neck when moving head up and down.

## 2014-02-14 ENCOUNTER — Encounter (HOSPITAL_COMMUNITY): Payer: Self-pay | Admitting: Orthopedic Surgery

## 2014-02-14 DIAGNOSIS — M5000 Cervical disc disorder with myelopathy, unspecified cervical region: Secondary | ICD-10-CM | POA: Diagnosis not present

## 2014-02-14 NOTE — Progress Notes (Signed)
    Patient doing well, resolved arm symptomatolgy but reports "I have not done much yet to know for sure" moderate neck px and stiffness as expected well controlled. He states he did much better with anesthesia this time compared to in past, no N/V, he does have HA that he states increases with Valsalva, improving overall through and pleased with reduced anesthesiology side effects with this surgery compared to in past. Reports moderate swallowing difficulties and hoarseness of voice, has been drinking and eating soft foods   Physical Exam: BP 119/86  Pulse 70  Temp(Src) 97.7 F (36.5 C) (Oral)  Resp 18  Wt 96.616 kg (213 lb)  SpO2 92%  Pt laying in hospital bed lights dimmed, cool towel on forehead. Hard collar in place, neck soft supple, dressing CDI,  NVI, SCD's in place  POD #1 s/p C3-5 ACDF for Myeloradiculopathy resolved arm and hand symptoms at this venture, mod neck pain  - PO HA from anesthesia, hx of difficulty with anesthesia, no N/V currently - Encourage ambulation - Encourage PO intake breakfast - Chloraseptic spray for throat - Percocet for pain, Valium for muscle spasms PRN - [place] collar at bedside - Likely D/C home after breakfast and ambulation if HA abates   - Scripts signed in chart  - D/C orders printed  - F/U in office 2 weeks

## 2014-02-14 NOTE — Progress Notes (Signed)
Patient alert and oriented, mae's well, voiding adequate amount of urine, swallowing without difficulty, c/o moderate pain and meds given prior to discharged. Patient discharged home with family. Script and discharged instructions given to patient. Patient and family stated understanding of instructions given.  

## 2014-02-14 NOTE — Progress Notes (Signed)
Orthopedic Tech Progress Note Patient Details:  Brad Fleming Nov 13, 1965 [id number]  Ortho Devices Type of Ortho Device: [place] cervical collar Ortho Device/Splint Interventions: Application   Shawnie Pons 02/14/2014, 11:17 AM

## 2014-02-14 NOTE — Op Note (Signed)
NAMEJUVENCIO, VERDI NO.:  [id number]  MEDICAL RECORD NO.:  [id number]  LOCATION:  3C04C                        FACILITY:  [facility]  PHYSICIAN:  Estill Bamberg, MD      DATE OF BIRTH:  May 29, 1966  DATE OF PROCEDURE:  02/13/2014                              OPERATIVE REPORT   PREOPERATIVE DIAGNOSES: 1. C3-4, C4-5, degenerative disk disease. 2. Spinal cord compression, C3-4. 3. Right-sided neural foraminal stenosis, C4-5.  POSTOPERATIVE DIAGNOSES: 1. C3-4, C4-5, degenerative disk disease. 2. Spinal cord compression, C3-4. 3. Right-sided neural foraminal stenosis, C4-5.  PROCEDURE: 1. Anterior cervical decompression and fusion C3-4, C4-5. 2. Placement of anterior instrumentation C3-C5. 3. Insertion of interbody device x2. 4. Use of morselized allograft-DBX mix. 5. Intraoperative use of fluoroscopy.  SURGEON:  Estill Bamberg, MD  ASSISTANT:  Jason Coop, PA-C  ANESTHESIA:  General endotracheal anesthesia.  COMPLICATIONS:  None.  DISPOSITION:  Stable.  ESTIMATED BLOOD LOSS:  Minimal.  INDICATIONS FOR SURGERY:  Briefly, Mr. Taras is a very pleasant 48 year old male who did present to me with ongoing pain in the neck and bilateral arms.  An MRI did reveal the findings noted above.  The patient did proceed with conservative care, including epidural injections, of which he did get temporary relief.  However, he did continue to have pain.  Given the findings on his MRI, and his lack of resolution of pain, we did discuss proceeding with the procedure noted above.  The patient did fully understand the risks and limitations of the procedure.  The patient did also expressively understand any permanent dysfunction of the nerves may result in the lack of full recovery.  Patient did elect to proceed.  OPERATIVE DETAILS:  On February 13, 2014, the patient was brought to surgery and general endotracheal anesthesia was administered.  The patient was placed supine  on the hospital bed.  Antibiotics were given and the neck was prepped and draped.  A left transverse incision was then made.  A Smith-Robinson approach was used and the anterior spine was noted.  A lateral fluoroscopic view did confirm the appropriate operative levels.  I then subperiosteally exposed the vertebral bodies of C3, C4, and C5.  A self-retaining retractor was placed.  Distraction was then applied across the C4-5 interspace.  A standard diskectomy was performed.  The posterior longitudinal ligament was identified and entered using a nerve hook.  I then used #1 followed by #2 Kerrison to perform a thorough central and bilateral neural foraminal decompression. The endplates were then appropriately prepared.  I then placed a series of the interbody spacer trials.  The appropriate-sized interbody spacer was then packed with DBX mix and tamped into position.  Distraction was then discontinued.  I then turned my attention towards the C3-4 interspace.  Again, a diskectomy and decompression was performed as previously noted.  Once again, the endplates were prepared and the appropriate size interbody spacer was packed with DBX mix and tamped into position in the usual fashion.  I did use AP and lateral fluoroscopy while placing the implants.  I then chose the appropriate- sized anterior cervical plate, which was placed over the anterior cervical spine.  A 14 mm  screws were placed, 2 in each vertebral body from C3-C5 for total of 6 vertebral body screws.  I was very pleased with the press-fit and purchase of each of the screws.  The screws were then locked into the plate using the CAM locking mechanism.  The wound was then copiously irrigated.  I was very pleased with the final fluoroscopic images.  The platysma was then closed using 2-0 Vicryl. The skin was then closed using 3-0 Monocryl.  Benzoin and Steri-Strips were applied followed by sterile dressing.  All instrument counts  were correct at the termination of the procedure.  Of note, Jason Coop was my assistant throughout surgery, and did aid in essential retraction, suctioning and closure.     Estill Bamberg, MD     MD/MEDQ  D:  02/13/2014  T:  02/14/2014  Job:  [id number]  cc:   Dr. Leodis Sias Dr. Clarene Duke
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
## 2014-07-17 ENCOUNTER — Emergency Department (HOSPITAL_COMMUNITY)
Admission: EM | Admit: 2014-07-17 | Discharge: 2014-07-18 | Disposition: A | Discharge status: Home / Self Care | Payer: 59 | Attending: Emergency Medicine | Admitting: Emergency Medicine

## 2014-07-17 ENCOUNTER — Encounter (HOSPITAL_COMMUNITY): Payer: Self-pay | Admitting: Emergency Medicine

## 2014-07-17 DIAGNOSIS — Z8639 Personal history of other endocrine, nutritional and metabolic disease: Secondary | ICD-10-CM | POA: Diagnosis not present

## 2014-07-17 DIAGNOSIS — R42 Dizziness and giddiness: Secondary | ICD-10-CM

## 2014-07-17 DIAGNOSIS — F419 Anxiety disorder, unspecified: Secondary | ICD-10-CM | POA: Insufficient documentation

## 2014-07-17 DIAGNOSIS — G43909 Migraine, unspecified, not intractable, without status migrainosus: Secondary | ICD-10-CM | POA: Diagnosis not present

## 2014-07-17 DIAGNOSIS — Z8709 Personal history of other diseases of the respiratory system: Secondary | ICD-10-CM | POA: Diagnosis not present

## 2014-07-17 DIAGNOSIS — F329 Major depressive disorder, single episode, unspecified: Secondary | ICD-10-CM | POA: Insufficient documentation

## 2014-07-17 DIAGNOSIS — Z87891 Personal history of nicotine dependence: Secondary | ICD-10-CM | POA: Insufficient documentation

## 2014-07-17 DIAGNOSIS — Z8701 Personal history of pneumonia (recurrent): Secondary | ICD-10-CM | POA: Insufficient documentation

## 2014-07-17 DIAGNOSIS — R55 Syncope and collapse: Secondary | ICD-10-CM

## 2014-07-17 DIAGNOSIS — I951 Orthostatic hypotension: Secondary | ICD-10-CM | POA: Diagnosis not present

## 2014-07-17 DIAGNOSIS — Z79899 Other long term (current) drug therapy: Secondary | ICD-10-CM | POA: Diagnosis not present

## 2014-07-17 DIAGNOSIS — G8929 Other chronic pain: Secondary | ICD-10-CM | POA: Insufficient documentation

## 2014-07-17 DIAGNOSIS — R110 Nausea: Secondary | ICD-10-CM | POA: Insufficient documentation

## 2014-07-17 DIAGNOSIS — Z8719 Personal history of other diseases of the digestive system: Secondary | ICD-10-CM | POA: Diagnosis not present

## 2014-07-17 DIAGNOSIS — Z792 Long term (current) use of antibiotics: Secondary | ICD-10-CM | POA: Insufficient documentation

## 2014-07-17 LAB — CBC
HCT: 47.1 % (ref 39.0–52.0)
Hemoglobin: 16.1 g/dL (ref 13.0–17.0)
MCH: 29.3 pg (ref 26.0–34.0)
MCHC: 34.2 g/dL (ref 30.0–36.0)
MCV: 85.8 fL (ref 78.0–100.0)
Platelets: 222 10*3/uL (ref 150–400)
RBC: 5.49 MIL/uL (ref 4.22–5.81)
RDW: 13.2 % (ref 11.5–15.5)
WBC: 7.9 10*3/uL (ref 4.0–10.5)

## 2014-07-17 LAB — BASIC METABOLIC PANEL
ANION GAP: 7 (ref 5–15)
BUN: 14 mg/dL (ref 6–23)
CO2: 27 mmol/L (ref 19–32)
Calcium: 8.9 mg/dL (ref 8.4–10.5)
Chloride: 103 mmol/L (ref 96–112)
Creatinine, Ser: 1.1 mg/dL (ref 0.50–1.35)
GFR calc Af Amer: >90 mL/min (ref >90)
GFR, EST NON AFRICAN AMERICAN: 78 mL/min — AB (ref >90)
GLUCOSE: 96 mg/dL (ref 70–99)
POTASSIUM: 4 mmol/L (ref 3.5–5.1)
Sodium: 137 mmol/L (ref 135–145)

## 2014-07-17 MED ORDER — SODIUM CHLORIDE 0.9 % IV BOLUS (SEPSIS)
1000.0000 mL | Freq: Once | INTRAVENOUS | Status: AC
  Administered 2014-07-18: 1000 mL via INTRAVENOUS

## 2014-07-17 NOTE — ED Notes (Signed)
Pt states that he was going up the stairs tonight and started to feel dizzy, nauseated and had to sit down. States after he got back up he took his BP and it was 170s systolic. Alert and oriented at this time.

## 2014-07-17 NOTE — ED Notes (Signed)
Pt. On cardiac monitor. 

## 2014-07-18 LAB — HEPATIC FUNCTION PANEL
ALT: 19 U/L (ref 0–53)
AST: 19 U/L (ref 0–37)
Albumin: 4.5 g/dL (ref 3.5–5.2)
Alkaline Phosphatase: 85 U/L (ref 39–117)
BILIRUBIN TOTAL: 0.5 mg/dL (ref 0.3–1.2)
Bilirubin, Direct: 0.1 mg/dL (ref 0.0–0.5)
Indirect Bilirubin: 0.4 mg/dL (ref 0.3–0.9)
TOTAL PROTEIN: 8.1 g/dL (ref 6.0–8.3)

## 2014-07-18 LAB — TROPONIN I: Troponin I: <0.03 ng/mL (ref <0.031)

## 2014-07-18 MED ORDER — DIPHENHYDRAMINE HCL 50 MG/ML IJ SOLN
25.0000 mg | Freq: Once | INTRAMUSCULAR | Status: AC
  Administered 2014-07-18: 25 mg via INTRAVENOUS
  Filled 2014-07-18: qty 1

## 2014-07-18 MED ORDER — KETOROLAC TROMETHAMINE 30 MG/ML IJ SOLN
30.0000 mg | Freq: Once | INTRAMUSCULAR | Status: AC
Start: 2014-07-18 — End: 2014-07-18
  Administered 2014-07-18: 30 mg via INTRAVENOUS
  Filled 2014-07-18: qty 1

## 2014-07-18 MED ORDER — METOCLOPRAMIDE HCL 5 MG/ML IJ SOLN
10.0000 mg | Freq: Once | INTRAMUSCULAR | Status: AC
  Administered 2014-07-18: 10 mg via INTRAVENOUS
  Filled 2014-07-18: qty 2

## 2014-07-18 NOTE — ED Provider Notes (Signed)
CSN: [id number]     Arrival date & time 07/17/14  2253 History   First MD Initiated Contact with Patient 07/17/14 2308     Chief Complaint  Patient presents with  . Dizziness  . Near Syncope     (Consider location/radiation/quality/duration/timing/severity/associated sxs/prior Treatment) HPI Comments: 49 y.o comes in with cc of dizziness. Pt has hx of chronic pain problems, and is on narcotic medicine and lyrica. He reports that he has trouble with insomnia, and hasnt slept well the last few days. He also has been feeling under the weather, and going through some stress. Pt reports that this evening, he was walking up the stairs, and got dizzy, nauseated, and felt like he was going to pass out. Pt has no cardiac hx, no DM, HRN, smoking hx or illicit drug use outside of occasional marijuana. No new meds, no abrupt stoppage of any meds. Pt has no other neurologic complains. Currently, he has no dizziness, but when he ambulates, he occasionally gets dizzy, unsteady and nauseated.   Patient is a 49 y.o. male presenting with dizziness and near-syncope. The history is provided by the patient.  Dizziness Associated symptoms: headaches and nausea   Near Syncope Associated symptoms include headaches.    Past Medical History  Diagnosis Date  . Colitis, ulcerative   . Chronic back pain   . H/O: GI bleed   . Hx of migraines   . Colitis     History of  . History of acute bronchitis   . History of ulcerative colitis   . Sinusitis   . Depression   . GERD (gastroesophageal reflux disease)   . Hyperlipidemia   . Depression   . Migraine   . PONV (postoperative nausea and vomiting)     causes severe migraine and severe nausea and vomiting  . Palpitations     Had stress test but patient stated it was normal and related palpitations to caffiene  . Bronchitis   . Pneumonia     hx of  . Anxiety   . Painful urination     Painful at times, pt thinks its due to his medications  . Constipation  due to pain medication   . Vertigo   . Insomnia    Past Surgical History  Procedure Laterality Date  . Hernia repair    . Knee surgery    . Umbilical hernia repair    . Right thumb surgery    . Right shoulder arthroscopy    . Hand surgery Right     X 2  . Forearm surgery Right   . Nasal sinus surgery      X 2  . Anterior cervical decomp/discectomy fusion N/A 02/13/2014    Procedure: ANTERIOR CERVICAL DECOMPRESSION/DISCECTOMY FUSION 2 LEVELS;  Surgeon: Emilee Hero, MD;  Location: [facility] OR;  Service: Orthopedics;  Laterality: N/A;  Anterior cervical decompression fusion, cervical 3-4, cervical 4-5 with instrumentation and allograft   Family History  Problem Relation Age of Onset  . Cancer - Prostate Father   . COPD Mother   . Alcoholism Brother    History  Substance Use Topics  . Smoking status: Former Smoker    Types: Cigarettes  . Smokeless tobacco: Never Used  . Alcohol Use: No    Review of Systems  Constitutional: Negative for diaphoresis.  HENT: Negative for drooling.   Eyes: Negative for photophobia and visual disturbance.  Cardiovascular: Positive for near-syncope.  Gastrointestinal: Positive for nausea.  Neurological: Positive for dizziness, light-headedness and  headaches. Negative for seizures, facial asymmetry, speech difficulty, weakness and numbness.  Psychiatric/Behavioral: Negative for confusion.  All other systems reviewed and are negative.     Allergies  Nsaids; Opana; Peanut-containing drug products; and Midazolam hcl  Home Medications   Prior to Admission medications   Medication Sig Start Date End Date Taking? Authorizing Provider  azelastine (ASTELIN) 0.1 % nasal spray Place 1 spray into both nostrils daily as needed for rhinitis. Use in each nostril as directed   Yes Historical Provider, MD  butalbital-acetaminophen-caffeine (FIORICET, ESGIC) 50-325-40 MG per tablet Take 1-2 tablets by mouth every 4 (four) hours as needed for migraine.     Yes Historical Provider, MD  cephALEXin (KEFLEX) 500 MG capsule Take 500 mg by mouth 2 (two) times daily. Take 2 tablets by mouth twice a day   Yes Historical Provider, MD  cyclobenzaprine (FLEXERIL) 5 MG tablet Take 5 mg by mouth 3 (three) times daily as needed for muscle spasms.   Yes Historical Provider, MD  diazepam (VALIUM) 5 MG tablet Take 5 mg by mouth every 6 (six) hours as needed for anxiety or muscle spasms.   Yes Historical Provider, MD  diphenhydrAMINE (BENADRYL) 25 mg capsule Take 50-75 mg by mouth 3 (three) times daily as needed for allergies.    Yes Historical Provider, MD  HYDROcodone Bitartrate 30 MG C12A Take 1 capsule by mouth 2 (two) times daily.   Yes Historical Provider, MD  HYDROcodone-acetaminophen (NORCO) 10-325 MG per tablet Take 1 tablet by mouth 2 (two) times daily.   Yes Historical Provider, MD  meclizine (ANTIVERT) 25 MG tablet Take 25 mg by mouth 3 (three) times daily as needed for dizziness.   Yes Historical Provider, MD  methocarbamol (ROBAXIN) 750 MG tablet Take 750 mg by mouth 3 (three) times daily.    Yes Historical Provider, MD  mupirocin ointment (BACTROBAN) 2 % Place 1 application into the nose 3 (three) times daily. Apply three times daily for 10 days to left nostril   Yes Historical Provider, MD  ondansetron (ZOFRAN-ODT) 4 MG disintegrating tablet Take 4 mg by mouth every 8 (eight) hours as needed for nausea or vomiting.   Yes Historical Provider, MD  oxymetazoline (ANEFRIN NASAL SPRAY) 0.05 % nasal spray Place 1 spray into both nostrils daily as needed for congestion.   Yes Historical Provider, MD  pregabalin (LYRICA) 225 MG capsule Take 225 mg by mouth 2 (two) times daily.    Yes Historical Provider, MD  RABEprazole (ACIPHEX) 20 MG tablet Take 20 mg by mouth daily as needed (acid reflux).    Yes Historical Provider, MD  SUMAtriptan (IMITREX) 6 MG/0.5ML SOLN injection Inject 6 mg into the skin every 2 (two) hours as needed for migraine or headache.    Yes  Historical Provider, MD  tamsulosin (FLOMAX) 0.4 MG CAPS capsule Take 0.8 mg by mouth daily after supper.   Yes Historical Provider, MD   BP 110/81 mmHg  Pulse 72  Temp(Src) 97.9 F (36.6 C) (Oral)  Resp 12  Ht 6' (1.829 m)  Wt 220 lb (99.791 kg)  BMI 29.83 kg/m2  SpO2 95% Physical Exam  Constitutional: He is oriented to person, place, and time. He appears well-developed and well-nourished.  HENT:  Head: Normocephalic and atraumatic.  Eyes: EOM are normal. Pupils are equal, round, and reactive to light.  Neck: Normal range of motion. Neck supple. No JVD present.  Cardiovascular: Normal rate and regular rhythm.   Pulmonary/Chest: Effort normal and breath sounds normal. No  respiratory distress. He has no wheezes.  Abdominal: Soft. Bowel sounds are normal. He exhibits no distension. There is no tenderness. There is no rebound and no guarding.  Neurological: He is alert and oriented to person, place, and time. No cranial nerve deficit. Coordination normal.  NIHSS - 0 No objective sensory deficits, Motor strength upper and lower extremity 4+ and equal Normal cerebellar exam No nystagmus  Skin: Skin is warm and dry.  Nursing note reviewed.   ED Course  Procedures (including critical care time) Labs Review Labs Reviewed  BASIC METABOLIC PANEL - Abnormal; Notable for the following:    GFR calc non Af Amer 78 (*)    All other components within normal limits  CBC  TROPONIN I  HEPATIC FUNCTION PANEL    Imaging Review No results found.   EKG Interpretation   Date/Time:  Thursday July 17 2014 23:04:01 EST Ventricular Rate:  76 PR Interval:  163 QRS Duration: 90 QT Interval:  381 QTC Calculation: 428 R Axis:   -19 Text Interpretation:  Sinus rhythm Borderline left axis deviation Abnormal  R-wave progression, late transition Baseline wander in lead(s) V1 V2 V3 V4  V5 V6 Sinus rhythm Left axis deviation Artifact No significant change  since last tracing Abnormal ekg  Confirmed by Gerhard MunchLOCKWOOD, ROBERT  MD [phone])  on 07/17/2014 11:16:03 PM      Reassessment at 2 am - pt states that he coughed, and started having right sided eye pain and headache. Eye pain moderate. No new blurred vision (has glasses, which he doesn't have with him). Eyes show no injection. Will get eye pressure if pain persists.  @4 :30 - discussed all lab findings. Curently, no nausea, no dizziness, He has ambulated. Eye pain is mild. Dont think he has glaucoma. Eye grossly is clear. Will d.c  MDM   Final diagnoses:  Orthostatic dizziness  Near syncope   Pt comes in with dizziness, near syncope. Appears that he was ambulating when his sx started, and he had to sit down. He does have some vasovagal type sx. He is on pain meds and psych meds - they could be possible cause of his symptoms. Neuro exam is non focal. There is no cardiac prodrome, and pt has no cardiac risk factors.     Derwood KaplanAnkit Desirey Keahey, MD 07/18/14 [phone]

## 2014-07-18 NOTE — ED Notes (Signed)
Pt stating he is having a severe migraine, states the top of his head hurts and his R eye. Given cold washcloth to place on head.

## 2014-07-18 NOTE — Discharge Instructions (Signed)
All the results in the ER are normal. We suspect that you have a bit of dehydration and possible medication side effects.  The workup in the ER is not complete, and is limited to screening for life threatening and emergent conditions only, so please see a primary care doctor for further evaluation.   Dizziness Dizziness is a common problem. It is a feeling of unsteadiness or light-headedness. You may feel like you are about to faint. Dizziness can lead to injury if you stumble or fall. A person of any age group can suffer from dizziness, but dizziness is more common in older adults. CAUSES  Dizziness can be caused by many different things, including:  Middle ear problems.  Standing for too long.  Infections.  An allergic reaction.  Aging.  An emotional response to something, such as the sight of blood.  Side effects of medicines.  Tiredness.  Problems with circulation or blood pressure.  Excessive use of alcohol or medicines, or illegal drug use.  Breathing too fast (hyperventilation).  An irregular heart rhythm (arrhythmia).  A low red blood cell count (anemia).  Pregnancy.  Vomiting, diarrhea, fever, or other illnesses that cause body fluid loss (dehydration).  Diseases or conditions such as Parkinson's disease, high blood pressure (hypertension), diabetes, and thyroid problems.  Exposure to extreme heat. DIAGNOSIS  Your health care provider will ask about your symptoms, perform a physical exam, and perform an electrocardiogram (ECG) to record the electrical activity of your heart. Your health care provider may also perform other heart or blood tests to determine the cause of your dizziness. These may include:  Transthoracic echocardiogram (TTE). During echocardiography, sound waves are used to evaluate how blood flows through your heart.  Transesophageal echocardiogram (TEE).  Cardiac monitoring. This allows your health care provider to monitor your heart rate and  rhythm in real time.  Holter monitor. This is a portable device that records your heartbeat and can help diagnose heart arrhythmias. It allows your health care provider to track your heart activity for several days if needed.  Stress tests by exercise or by giving medicine that makes the heart beat faster. TREATMENT  Treatment of dizziness depends on the cause of your symptoms and can vary greatly. HOME CARE INSTRUCTIONS   Drink enough fluids to keep your urine clear or pale yellow. This is especially important in very hot weather. In older adults, it is also important in cold weather.  Take your medicine exactly as directed if your dizziness is caused by medicines. When taking blood pressure medicines, it is especially important to get up slowly.  Rise slowly from chairs and steady yourself until you feel okay.  In the morning, first sit up on the side of the bed. When you feel okay, stand slowly while holding onto something until you know your balance is fine.  Move your legs often if you need to stand in one place for a long time. Tighten and relax your muscles in your legs while standing.  Have someone stay with you for 1-2 days if dizziness continues to be a problem. Do this until you feel you are well enough to stay alone. Have the person call your health care provider if he or she notices changes in you that are concerning.  Do not drive or use heavy machinery if you feel dizzy.  Do not drink alcohol. SEEK IMMEDIATE MEDICAL CARE IF:   Your dizziness or light-headedness gets worse.  You feel nauseous or vomit.  You have problems  talking, walking, or using your arms, hands, or legs.  You feel weak.  You are not thinking clearly or you have trouble forming sentences. It may take a friend or family member to notice this.  You have chest pain, abdominal pain, shortness of breath, or sweating.  Your vision changes.  You notice any bleeding.  You have side effects from  medicine that seems to be getting worse rather than better. MAKE SURE YOU:   Understand these instructions.  Will watch your condition.  Will get help right away if you are not doing well or get worse. Document Released: 11/30/2000 Document Revised: 06/11/2013 Document Reviewed: 12/24/2010 [facility] Patient Information 2015 [place]. This information is not intended to replace advice given to you by your health care provider. Make sure you discuss any questions you have with your health care provider. Orthostatic Hypotension Orthostatic hypotension is a sudden drop in blood pressure. It happens when you quickly stand up from a seated or lying position. You may feel dizzy or light-headed. This can last for just a few seconds or for up to a few minutes. It is usually not a serious problem. However, if this happens frequently or gets worse, it can be a sign of something more serious. CAUSES  Different things can cause orthostatic hypotension, including:   Loss of body fluids (dehydration).  Medicines that lower blood pressure.  Sudden changes in posture, such as standing up quickly after you have been sitting or lying down.  Taking too much of your medicine. SIGNS AND SYMPTOMS   Light-headedness or dizziness.   Fainting or near-fainting.   A fast heart rate.   Weakness.   Feeling tired (fatigue).  DIAGNOSIS  Your health care provider may do several things to help diagnose your condition and identify the cause. These may include:   Taking a medical history and doing a physical exam.  Checking your blood pressure. Your health care provider will check your blood pressure when you are:  Lying down.  Sitting.  Standing.  Using tilt table testing. In this test, you lie down on a table that moves from a lying position to a standing position. You will be strapped onto the table. This test monitors your blood pressure and heart rate when you are in different  positions. TREATMENT  Treatment will vary depending on the cause. Possible treatments include:   Changing the dosage of your medicines.  Wearing compression stockings on your lower legs.  Standing up slowly after sitting or lying down.  Eating more salt.  Eating frequent, small meals.  In some cases, getting IV fluids.  Taking medicine to enhance fluid retention. HOME CARE INSTRUCTIONS  Only take over-the-counter or prescription medicines as directed by your health care provider.  Follow your health care provider's instructions for changing the dosage of your current medicines.  Do not stop or adjust your medicine on your own.  Stand up slowly after sitting or lying down. This allows your body to adjust to the different position.  Wear compression stockings as directed.  Eat extra salt as directed.  Do not add extra salt to your diet unless directed to by your health care provider.  Eat frequent, small meals.  Avoid standing suddenly after eating.  Avoid hot showers or excessive heat as directed by your health care provider.  Keep all follow-up appointments. SEEK MEDICAL CARE IF:  You continue to feel dizzy or light-headed after standing.  You feel groggy or confused.  You feel cold, clammy,  or sick to your stomach (nauseous).  You have blurred vision.  You feel short of breath. SEEK IMMEDIATE MEDICAL CARE IF:   You faint after standing.  You have chest pain.  You have difficulty breathing.   You lose feeling or movement in your arms or legs.   You have slurred speech or difficulty talking, or you are unable to talk.  MAKE SURE YOU:   Understand these instructions.  Will watch your condition.  Will get help right away if you are not doing well or get worse. Document Released: 05/27/2002 Document Revised: 06/11/2013 Document Reviewed: 03/29/2013 [facility] Patient Information 2015 [employer], [place]. This information is not intended to replace  advice given to you by your health care provider. Make sure you discuss any questions you have with your health care provider.

## 2014-12-01 ENCOUNTER — Encounter (HOSPITAL_COMMUNITY): Payer: Self-pay

## 2014-12-01 ENCOUNTER — Emergency Department (HOSPITAL_COMMUNITY)
Admission: EM | Admit: 2014-12-01 | Discharge: 2014-12-01 | Disposition: A | Discharge status: Home / Self Care | Payer: 59 | Attending: Emergency Medicine | Admitting: Emergency Medicine

## 2014-12-01 DIAGNOSIS — S29092A Other injury of muscle and tendon of back wall of thorax, initial encounter: Secondary | ICD-10-CM | POA: Insufficient documentation

## 2014-12-01 DIAGNOSIS — Z792 Long term (current) use of antibiotics: Secondary | ICD-10-CM | POA: Diagnosis not present

## 2014-12-01 DIAGNOSIS — Y9389 Activity, other specified: Secondary | ICD-10-CM | POA: Insufficient documentation

## 2014-12-01 DIAGNOSIS — F419 Anxiety disorder, unspecified: Secondary | ICD-10-CM | POA: Diagnosis not present

## 2014-12-01 DIAGNOSIS — G43909 Migraine, unspecified, not intractable, without status migrainosus: Secondary | ICD-10-CM | POA: Insufficient documentation

## 2014-12-01 DIAGNOSIS — Z87891 Personal history of nicotine dependence: Secondary | ICD-10-CM | POA: Insufficient documentation

## 2014-12-01 DIAGNOSIS — Z8639 Personal history of other endocrine, nutritional and metabolic disease: Secondary | ICD-10-CM | POA: Insufficient documentation

## 2014-12-01 DIAGNOSIS — Z87828 Personal history of other (healed) physical injury and trauma: Secondary | ICD-10-CM | POA: Diagnosis not present

## 2014-12-01 DIAGNOSIS — Z8669 Personal history of other diseases of the nervous system and sense organs: Secondary | ICD-10-CM | POA: Insufficient documentation

## 2014-12-01 DIAGNOSIS — Y9289 Other specified places as the place of occurrence of the external cause: Secondary | ICD-10-CM | POA: Diagnosis not present

## 2014-12-01 DIAGNOSIS — G8929 Other chronic pain: Secondary | ICD-10-CM | POA: Insufficient documentation

## 2014-12-01 DIAGNOSIS — M546 Pain in thoracic spine: Secondary | ICD-10-CM

## 2014-12-01 DIAGNOSIS — Z79899 Other long term (current) drug therapy: Secondary | ICD-10-CM | POA: Insufficient documentation

## 2014-12-01 DIAGNOSIS — Z8709 Personal history of other diseases of the respiratory system: Secondary | ICD-10-CM | POA: Diagnosis not present

## 2014-12-01 DIAGNOSIS — K219 Gastro-esophageal reflux disease without esophagitis: Secondary | ICD-10-CM | POA: Diagnosis not present

## 2014-12-01 DIAGNOSIS — W1839XA Other fall on same level, initial encounter: Secondary | ICD-10-CM | POA: Insufficient documentation

## 2014-12-01 DIAGNOSIS — Z8701 Personal history of pneumonia (recurrent): Secondary | ICD-10-CM | POA: Insufficient documentation

## 2014-12-01 DIAGNOSIS — Y998 Other external cause status: Secondary | ICD-10-CM | POA: Diagnosis not present

## 2014-12-01 MED ORDER — HYDROCODONE BITARTRATE 30 MG PO C12A: 30.0000 mg | EXTENDED_RELEASE_CAPSULE | Freq: Two times a day (BID) | ORAL | Status: AC

## 2014-12-01 MED ORDER — HYDROCODONE-ACETAMINOPHEN 5-325 MG PO TABS
2.0000 | ORAL_TABLET | Freq: Once | ORAL | Status: AC
  Administered 2014-12-01: 2 via ORAL
  Filled 2014-12-01: qty 2

## 2014-12-01 MED ORDER — CYCLOBENZAPRINE HCL 10 MG PO TABS: 10.0000 mg | ORAL_TABLET | Freq: Two times a day (BID) | ORAL | Status: AC | PRN

## 2014-12-01 NOTE — ED Notes (Signed)
Pt presents with c/o back pain. Pt reports he has a hx of back pain. Pt reports he fell a couple of days ago and now his back "crunches" every time he takes a right step. Pt also reporting pain down his right leg with the back pain.

## 2014-12-01 NOTE — Discharge Instructions (Signed)
Back Pain, Adult Low back pain is very common. About 1 in 5 people have back pain.The cause of low back pain is rarely dangerous. The pain often gets better over time.About half of people with a sudden onset of back pain feel better in just 2 weeks. About 8 in 10 people feel better by 6 weeks.  CAUSES Some common causes of back pain include:  Strain of the muscles or ligaments supporting the spine.  Wear and tear (degeneration) of the spinal discs.  Arthritis.  Direct injury to the back. DIAGNOSIS Most of the time, the direct cause of low back pain is not known.However, back pain can be treated effectively even when the exact cause of the pain is unknown.Answering your caregiver's questions about your overall health and symptoms is one of the most accurate ways to make sure the cause of your pain is not dangerous. If your caregiver needs more information, he or she may order lab work or imaging tests (X-rays or MRIs).However, even if imaging tests show changes in your back, this usually does not require surgery. HOME CARE INSTRUCTIONS For many people, back pain returns.Since low back pain is rarely dangerous, it is often a condition that people can learn to manageon their own.   Remain active. It is stressful on the back to sit or stand in one place. Do not sit, drive, or stand in one place for more than 30 minutes at a time. Take short walks on level surfaces as soon as pain allows.Try to increase the length of time you walk each day.  Do not stay in bed.Resting more than 1 or 2 days can delay your recovery.  Do not avoid exercise or work.Your body is made to move.It is not dangerous to be active, even though your back may hurt.Your back will likely heal faster if you return to being active before your pain is gone.  Pay attention to your body when you bend and lift. Many people have less discomfortwhen lifting if they bend their knees, keep the load close to their bodies,and  avoid twisting. Often, the most comfortable positions are those that put less stress on your recovering back.  Find a comfortable position to sleep. Use a firm mattress and lie on your side with your knees slightly bent. If you lie on your back, put a pillow under your knees.  Only take over-the-counter or prescription medicines as directed by your caregiver. Over-the-counter medicines to reduce pain and inflammation are often the most helpful.Your caregiver may prescribe muscle relaxant drugs.These medicines help dull your pain so you can more quickly return to your normal activities and healthy exercise.  Put ice on the injured area.  Put ice in a plastic bag.  Place a towel between your skin and the bag.  Leave the ice on for 15-20 minutes, 03-04 times a day for the first 2 to 3 days. After that, ice and heat may be alternated to reduce pain and spasms.  Ask your caregiver about trying back exercises and gentle massage. This may be of some benefit.  Avoid feeling anxious or stressed.Stress increases muscle tension and can worsen back pain.It is important to recognize when you are anxious or stressed and learn ways to manage it.Exercise is a great option. SEEK MEDICAL CARE IF:  You have pain that is not relieved with rest or medicine.  You have pain that does not improve in 1 week.  You have new symptoms.  You are generally not feeling well. SEEK   IMMEDIATE MEDICAL CARE IF:   You have pain that radiates from your back into your legs.  You develop new bowel or bladder control problems.  You have unusual weakness or numbness in your arms or legs.  You develop nausea or vomiting.  You develop abdominal pain.  You feel faint. Document Released: 06/06/2005 Document Revised: 12/06/2011 Document Reviewed: 10/08/2013 [employer] Patient Information 2015 [employer]. This information is not intended to replace advice given to you by your health care provider. Make sure you  discuss any questions you have with your health care provider.  

## 2014-12-01 NOTE — ED Provider Notes (Signed)
CSN: [id number]     Arrival date & time 12/01/14  2047 History  This chart was scribed for non-physician provider Elpidio Anis, PA-C, working with Lorre Nick, MD by Phillis Haggis, ED Scribe. This patient was seen in room WTR7/WTR7 and patient care was started at 11:02 PM.    Chief Complaint  Patient presents with  . Back Pain   The history is provided by the patient. No language interpreter was used.  HPI Comments: Brad Fleming is a 49 y.o. male with a history of chronic back pain who presents to the Emergency Department complaining of back pain onset 3 days ago. He states that he fell 3 days ago and his back pain has worsened. He states that he has been sneezing a lot and it worsens his back pain; states that he has to physically pick up his leg to move it. He states that he has pain when he looks to the left in his neck that radiates his arm; back pain starts in the middle of his back and radiates to his legs. He states that with every right step he takes, his back cracks. He states that it feels tight, like he is wearing a corset. He states that he was on pain management but is currently trying to transfer it to [place] since he is moving. He states that he has run out of some of his pain medication but his [place] PCP will not write him a prescription. He states that his insurance keeps him from seeing pain management.  He states that he is supposed to be seen at [facility] in July but his prescription did not last until then. He denies abdominal pain, loss of control of bladder or bowels, numbness or weakness. He states that he has an orthopedic doctor and will make an appointment with him.   Past Medical History  Diagnosis Date  . Colitis, ulcerative   . Chronic back pain   . H/O: GI bleed   . Hx of migraines   . Colitis     History of  . History of acute bronchitis   . History of ulcerative colitis   . Sinusitis   . Depression   . GERD (gastroesophageal reflux disease)   .  Hyperlipidemia   . Depression   . Migraine   . PONV (postoperative nausea and vomiting)     causes severe migraine and severe nausea and vomiting  . Palpitations     Had stress test but patient stated it was normal and related palpitations to caffiene  . Bronchitis   . Pneumonia     hx of  . Anxiety   . Painful urination     Painful at times, pt thinks its due to his medications  . Constipation due to pain medication   . Vertigo   . Insomnia    Past Surgical History  Procedure Laterality Date  . Hernia repair    . Knee surgery    . Umbilical hernia repair    . Right thumb surgery    . Right shoulder arthroscopy    . Hand surgery Right     X 2  . Forearm surgery Right   . Nasal sinus surgery      X 2  . Anterior cervical decomp/discectomy fusion N/A 02/13/2014    Procedure: ANTERIOR CERVICAL DECOMPRESSION/DISCECTOMY FUSION 2 LEVELS;  Surgeon: Emilee Hero, MD;  Location: [facility] OR;  Service: Orthopedics;  Laterality: N/A;  Anterior cervical decompression fusion, cervical 3-4,  cervical 4-5 with instrumentation and allograft   Family History  Problem Relation Age of Onset  . Cancer - Prostate Father   . COPD Mother   . Alcoholism Brother    History  Substance Use Topics  . Smoking status: Former Smoker    Types: Cigarettes  . Smokeless tobacco: Never Used  . Alcohol Use: No    Review of Systems  Gastrointestinal: Negative for abdominal pain and diarrhea.  Genitourinary: Negative for dysuria and difficulty urinating.  Musculoskeletal: Positive for back pain and arthralgias.  Neurological: Negative for weakness and numbness.   Allergies  Nsaids; Opana; Peanut-containing drug products; and Midazolam hcl  Home Medications   Prior to Admission medications   Medication Sig Start Date End Date Taking? Authorizing Provider  azelastine (ASTELIN) 0.1 % nasal spray Place 1 spray into both nostrils daily as needed for rhinitis. Use in each nostril as directed     Historical Provider, MD  butalbital-acetaminophen-caffeine (FIORICET, ESGIC) 50-325-40 MG per tablet Take 1-2 tablets by mouth every 4 (four) hours as needed for migraine.     Historical Provider, MD  cephALEXin (KEFLEX) 500 MG capsule Take 500 mg by mouth 2 (two) times daily. Take 2 tablets by mouth twice a day    Historical Provider, MD  cyclobenzaprine (FLEXERIL) 5 MG tablet Take 5 mg by mouth 3 (three) times daily as needed for muscle spasms.    Historical Provider, MD  diazepam (VALIUM) 5 MG tablet Take 5 mg by mouth every 6 (six) hours as needed for anxiety or muscle spasms.    Historical Provider, MD  diphenhydrAMINE (BENADRYL) 25 mg capsule Take 50-75 mg by mouth 3 (three) times daily as needed for allergies.     Historical Provider, MD  HYDROcodone Bitartrate 30 MG C12A Take 1 capsule by mouth 2 (two) times daily.    Historical Provider, MD  HYDROcodone-acetaminophen (NORCO) 10-325 MG per tablet Take 1 tablet by mouth 2 (two) times daily.    Historical Provider, MD  meclizine (ANTIVERT) 25 MG tablet Take 25 mg by mouth 3 (three) times daily as needed for dizziness.    Historical Provider, MD  methocarbamol (ROBAXIN) 750 MG tablet Take 750 mg by mouth 3 (three) times daily.     Historical Provider, MD  mupirocin ointment (BACTROBAN) 2 % Place 1 application into the nose 3 (three) times daily. Apply three times daily for 10 days to left nostril    Historical Provider, MD  ondansetron (ZOFRAN-ODT) 4 MG disintegrating tablet Take 4 mg by mouth every 8 (eight) hours as needed for nausea or vomiting.    Historical Provider, MD  oxymetazoline (ANEFRIN NASAL SPRAY) 0.05 % nasal spray Place 1 spray into both nostrils daily as needed for congestion.    Historical Provider, MD  pregabalin (LYRICA) 225 MG capsule Take 225 mg by mouth 2 (two) times daily.     Historical Provider, MD  RABEprazole (ACIPHEX) 20 MG tablet Take 20 mg by mouth daily as needed (acid reflux).     Historical Provider, MD   SUMAtriptan (IMITREX) 6 MG/0.5ML SOLN injection Inject 6 mg into the skin every 2 (two) hours as needed for migraine or headache.     Historical Provider, MD  tamsulosin (FLOMAX) 0.4 MG CAPS capsule Take 0.8 mg by mouth daily after supper.    Historical Provider, MD   BP 131/87 mmHg  Pulse 94  Temp(Src) 97.8 F (36.6 C) (Oral)  Resp 18  SpO2 100%  Physical Exam  Constitutional: He is  oriented to person, place, and time. He appears well-developed and well-nourished. No distress.  HENT:  Head: Normocephalic and atraumatic.  Eyes: Conjunctivae and EOM are normal.  Neck: Normal range of motion. Neck supple.  Cardiovascular: Normal rate, regular rhythm and normal heart sounds.   Pulmonary/Chest: Effort normal and breath sounds normal.  Abdominal: There is no tenderness.  Musculoskeletal: Normal range of motion. He exhibits no edema.  No appreciable tenderness of the thoracic or parathoracic area; Full ROM, guarded,. Reflexes lower extremities equal; full and equal strength of lower extremities; negative SLR.   Neurological: He is alert and oriented to person, place, and time.  Skin: Skin is warm and dry.  Psychiatric: He has a normal mood and affect. His behavior is normal.  Nursing note and vitals reviewed.   ED Course  Procedures (including critical care time) DIAGNOSTIC STUDIES: Oxygen Saturation is 100% on room air, normal by my interpretation.    COORDINATION OF CARE: 11:08 PM-Discussed treatment plan which includes flexeril and pain medication with pt at bedside and pt agreed to plan.   Labs Review Labs Reviewed - No data to display  Imaging Review No results found.   EKG Interpretation None      MDM   Final diagnoses:  None    1. Back pain 2. Chronic back pain  He is well appearing, VSS, no focal neurologic deficits on exam. He is ambulatory. Will provide pain relief and strongly encourage follow up with PCP for pain management.  I personally performed the  services described in this documentation, which was scribed in my presence. The recorded information has been reviewed and is accurate.     Elpidio Anis, PA-C 12/02/14 0443  Elpidio Anis, PA-C 12/02/14 2130  Lorre Nick, MD 12/05/14 [phone]

## 2014-12-10 ENCOUNTER — Emergency Department (HOSPITAL_COMMUNITY)
Admission: EM | Admit: 2014-12-10 | Discharge: 2014-12-11 | Disposition: A | Discharge status: Home / Self Care | Payer: 59 | Attending: Emergency Medicine | Admitting: Emergency Medicine

## 2014-12-10 ENCOUNTER — Encounter (HOSPITAL_COMMUNITY): Payer: Self-pay

## 2014-12-10 DIAGNOSIS — Z8639 Personal history of other endocrine, nutritional and metabolic disease: Secondary | ICD-10-CM | POA: Diagnosis not present

## 2014-12-10 DIAGNOSIS — G43909 Migraine, unspecified, not intractable, without status migrainosus: Secondary | ICD-10-CM | POA: Insufficient documentation

## 2014-12-10 DIAGNOSIS — Z87891 Personal history of nicotine dependence: Secondary | ICD-10-CM | POA: Insufficient documentation

## 2014-12-10 DIAGNOSIS — Z8709 Personal history of other diseases of the respiratory system: Secondary | ICD-10-CM | POA: Diagnosis not present

## 2014-12-10 DIAGNOSIS — F419 Anxiety disorder, unspecified: Secondary | ICD-10-CM | POA: Diagnosis not present

## 2014-12-10 DIAGNOSIS — K625 Hemorrhage of anus and rectum: Secondary | ICD-10-CM | POA: Diagnosis present

## 2014-12-10 DIAGNOSIS — Z8701 Personal history of pneumonia (recurrent): Secondary | ICD-10-CM | POA: Diagnosis not present

## 2014-12-10 DIAGNOSIS — G8929 Other chronic pain: Secondary | ICD-10-CM | POA: Insufficient documentation

## 2014-12-10 DIAGNOSIS — K51911 Ulcerative colitis, unspecified with rectal bleeding: Secondary | ICD-10-CM | POA: Insufficient documentation

## 2014-12-10 LAB — CBC
HCT: 44.8 % (ref 39.0–52.0)
HEMOGLOBIN: 15.1 g/dL (ref 13.0–17.0)
MCH: 29 pg (ref 26.0–34.0)
MCHC: 33.7 g/dL (ref 30.0–36.0)
MCV: 86.2 fL (ref 78.0–100.0)
PLATELETS: 252 10*3/uL (ref 150–400)
RBC: 5.2 MIL/uL (ref 4.22–5.81)
RDW: 13.9 % (ref 11.5–15.5)
WBC: 11.4 10*3/uL — ABNORMAL HIGH (ref 4.0–10.5)

## 2014-12-10 LAB — POC OCCULT BLOOD, ED: Fecal Occult Bld: POSITIVE — AB

## 2014-12-10 MED ORDER — SODIUM CHLORIDE 0.9 % IV BOLUS (SEPSIS)
1000.0000 mL | Freq: Once | INTRAVENOUS | Status: AC
  Administered 2014-12-11: 1000 mL via INTRAVENOUS

## 2014-12-10 NOTE — ED Notes (Signed)
Patient ambulatory to restroom  ?

## 2014-12-10 NOTE — ED Notes (Signed)
PA at bedside.

## 2014-12-10 NOTE — ED Notes (Signed)
Patient states he began having rectal bleeding this morning. Patient states he was not concerned until he became dizzy this evening. Patient states his pain is intermittent, 7/10 when worst, 0/10 at this time. Patient states his pain is "cramping". Patient states bleeding started out this morning as a small amount of bleeding with stool, now states it is more bleeding and less stool.

## 2014-12-10 NOTE — ED Notes (Signed)
Pt complains of bright red rectal bleeding today, pt's family states that he's not been coherent and doing odd things. Pt looks pale and he states that he's dizzy and weak

## 2014-12-10 NOTE — ED Provider Notes (Signed)
CSN: [id number]     Arrival date & time 12/10/14  2150 History   First MD Initiated Contact with Patient 12/10/14 2240     Chief Complaint  Patient presents with  . Rectal Bleeding     (Consider location/radiation/quality/duration/timing/severity/associated sxs/prior Treatment) HPI Comments: Patient with past medical history remarkable for ulcerative colitis, diverticulitis, presents to the emergency department with chief complaint of abdominal cramps and rectal bleeding. States symptoms started today. He reports associated lightheadedness and some blurred vision. States that the pain does not feel like diverticulitis. He denies any fevers chills. Denies any vomiting. He has not taken anything to alleviate his symptoms. There are no aggravating or alleviating factors. States that he has been feeling weak.  The history is provided by the patient. No language interpreter was used.    Past Medical History  Diagnosis Date  . Colitis, ulcerative   . Chronic back pain   . H/O: GI bleed   . Hx of migraines   . Colitis     History of  . History of acute bronchitis   . History of ulcerative colitis   . Sinusitis   . Depression   . GERD (gastroesophageal reflux disease)   . Hyperlipidemia   . Depression   . Migraine   . PONV (postoperative nausea and vomiting)     causes severe migraine and severe nausea and vomiting  . Palpitations     Had stress test but patient stated it was normal and related palpitations to caffiene  . Bronchitis   . Pneumonia     hx of  . Anxiety   . Painful urination     Painful at times, pt thinks its due to his medications  . Constipation due to pain medication   . Vertigo   . Insomnia    Past Surgical History  Procedure Laterality Date  . Hernia repair    . Knee surgery    . Umbilical hernia repair    . Right thumb surgery    . Right shoulder arthroscopy    . Hand surgery Right     X 2  . Forearm surgery Right   . Nasal sinus surgery      X 2   . Anterior cervical decomp/discectomy fusion N/A 02/13/2014    Procedure: ANTERIOR CERVICAL DECOMPRESSION/DISCECTOMY FUSION 2 LEVELS;  Surgeon: Emilee Hero, MD;  Location: [facility] OR;  Service: Orthopedics;  Laterality: N/A;  Anterior cervical decompression fusion, cervical 3-4, cervical 4-5 with instrumentation and allograft   Family History  Problem Relation Age of Onset  . Cancer - Prostate Father   . COPD Mother   . Alcoholism Brother    History  Substance Use Topics  . Smoking status: Former Smoker    Types: Cigarettes  . Smokeless tobacco: Never Used  . Alcohol Use: No    Review of Systems  Constitutional: Negative for fever and chills.  Respiratory: Negative for shortness of breath.   Cardiovascular: Negative for chest pain.  Gastrointestinal: Positive for blood in stool. Negative for nausea, vomiting, diarrhea and constipation.  Genitourinary: Negative for dysuria.  Neurological: Positive for weakness.  All other systems reviewed and are negative.     Allergies  Nsaids; Opana; Peanut-containing drug products; and Midazolam hcl  Home Medications   Prior to Admission medications   Medication Sig Start Date End Date Taking? Authorizing Provider  azelastine (ASTELIN) 0.1 % nasal spray Place 1 spray into both nostrils daily as needed for rhinitis. Use in each nostril  as directed   Yes Historical Provider, MD  butalbital-acetaminophen-caffeine (FIORICET, ESGIC) 50-325-40 MG per tablet Take 1-2 tablets by mouth every 4 (four) hours as needed for migraine (migraine).    Yes Historical Provider, MD  cyclobenzaprine (FLEXERIL) 10 MG tablet Take 1 tablet (10 mg total) by mouth 2 (two) times daily as needed for muscle spasms. 12/01/14  Yes Shari Upstill, PA-C  HYDROcodone Bitartrate (ZOHYDRO ER) 30 MG C12A Take 30 mg by mouth 2 (two) times daily. 12/01/14  Yes Shari Upstill, PA-C  ondansetron (ZOFRAN-ODT) 4 MG disintegrating tablet Take 4 mg by mouth every 8 (eight) hours as  needed for nausea or vomiting.   Yes Historical Provider, MD  oxymetazoline (ANEFRIN NASAL SPRAY) 0.05 % nasal spray Place 1 spray into both nostrils daily as needed for congestion.   Yes Historical Provider, MD  pregabalin (LYRICA) 225 MG capsule Take 225 mg by mouth 2 (two) times daily.    Yes Historical Provider, MD  RABEprazole (ACIPHEX) 20 MG tablet Take 20 mg by mouth daily as needed (acid reflux).    Yes Historical Provider, MD  tamsulosin (FLOMAX) 0.4 MG CAPS capsule Take 0.4-0.8 mg by mouth daily after supper.    Yes Historical Provider, MD  diazepam (VALIUM) 5 MG tablet Take 5 mg by mouth every 6 (six) hours as needed for anxiety or muscle spasms.    Historical Provider, MD  diphenhydrAMINE (BENADRYL) 25 mg capsule Take 50-75 mg by mouth 3 (three) times daily as needed for allergies.     Historical Provider, MD  methocarbamol (ROBAXIN) 750 MG tablet Take 750 mg by mouth every 6 (six) hours as needed for muscle spasms.     Historical Provider, MD  SUMAtriptan (IMITREX) 6 MG/0.5ML SOLN injection Inject 6 mg into the skin every 2 (two) hours as needed for migraine or headache.     Historical Provider, MD   BP 126/92 mmHg  Pulse 93  Temp(Src) 98 F (36.7 C) (Oral)  Resp 16  SpO2 100% Physical Exam  Constitutional: He is oriented to person, place, and time. He appears well-developed and well-nourished.  HENT:  Head: Normocephalic and atraumatic.  Eyes: Conjunctivae and EOM are normal. Pupils are equal, round, and reactive to light. Right eye exhibits no discharge. Left eye exhibits no discharge. No scleral icterus.  Neck: Normal range of motion. Neck supple. No JVD present.  Cardiovascular: Normal rate, regular rhythm and normal heart sounds.  Exam reveals no gallop and no friction rub.   No murmur heard. Pulmonary/Chest: Effort normal and breath sounds normal. No respiratory distress. He has no wheezes. He has no rales. He exhibits no tenderness.  Abdominal: Soft. He exhibits no  distension and no mass. There is no tenderness. There is no rebound and no guarding.  No focal abdominal tenderness, no RLQ tenderness or pain at McBurney's point, no RUQ tenderness or Murphy's sign, no left-sided abdominal tenderness, no fluid wave, or signs of peritonitis   Genitourinary:  Chaperone present for rectal exam, no external hemorrhoid, possible internal hemorrhoid, and there is bright red blood on gloved finger  Musculoskeletal: Normal range of motion. He exhibits no edema or tenderness.  Neurological: He is alert and oriented to person, place, and time.  Skin: Skin is warm and dry.  Psychiatric: He has a normal mood and affect. His behavior is normal. Judgment and thought content normal.  Nursing note and vitals reviewed.   ED Course  Procedures (including critical care time) Results for orders placed or performed during  the hospital encounter of 12/10/14  CBC  Result Value Ref Range   WBC 11.4 (H) 4.0 - 10.5 K/uL   RBC 5.20 4.22 - 5.81 MIL/uL   Hemoglobin 15.1 13.0 - 17.0 g/dL   HCT 19.7 58.8 - 32.5 %   MCV 86.2 78.0 - 100.0 fL   MCH 29.0 26.0 - 34.0 pg   MCHC 33.7 30.0 - 36.0 g/dL   RDW 49.8 26.4 - 15.8 %   Platelets 252 150 - 400 K/uL  Comprehensive metabolic panel  Result Value Ref Range   Sodium 139 135 - 145 mmol/L   Potassium 3.7 3.5 - 5.1 mmol/L   Chloride 106 101 - 111 mmol/L   CO2 25 22 - 32 mmol/L   Glucose, Bld 104 (H) 65 - 99 mg/dL   BUN 18 6 - 20 mg/dL   Creatinine, Ser 3.09 0.61 - 1.24 mg/dL   Calcium 8.9 8.9 - 40.7 mg/dL   Total Protein 7.5 6.5 - 8.1 g/dL   Albumin 4.1 3.5 - 5.0 g/dL   AST 15 15 - 41 U/L   ALT 16 (L) 17 - 63 U/L   Alkaline Phosphatase 74 38 - 126 U/L   Total Bilirubin 0.5 0.3 - 1.2 mg/dL   GFR calc non Af Amer >60 >60 mL/min   GFR calc Af Amer >60 >60 mL/min   Anion gap 8 5 - 15  POC occult blood, ED  Result Value Ref Range   Fecal Occult Bld POSITIVE (A) NEGATIVE  Type and screen for Red Blood Exchange  Result Value Ref  Range   ABO/RH(D) A POS    Antibody Screen PENDING    Sample Expiration 12/13/2014    No results found.   Imaging Review No results found.   EKG Interpretation None      MDM   Final diagnoses:  Ulcerative colitis, with rectal bleeding    Patient with rectal bleeding today. Worsened this morning. Also states that he has had a little bit of lightheadedness.  Not orthostatic on VS.  Patient states that he hasn't been drinking enough water, but has been drinking a lot of tea.  I have advised that he drink more water.  H/H is stable.  No evidence of anemia.  No active hemorrhage.  Bleeding suspected from palpated internal hemorrhoid.  Abdomen is soft and non-tender.  VSS.  Will recommend GI follow-up.  Will treat with prednisone and bentyl. Patient understands and agrees with the plan.  Discussed with Dr. Silverio Lay, who agrees with the plan.    Roxy Horseman, PA-C 12/11/14 0049  Richardean Canal, MD 12/11/14 [phone]

## 2014-12-11 LAB — TYPE AND SCREEN
ABO/RH(D): A POS
Antibody Screen: NEGATIVE

## 2014-12-11 LAB — COMPREHENSIVE METABOLIC PANEL
ALT: 16 U/L — ABNORMAL LOW (ref 17–63)
AST: 15 U/L (ref 15–41)
Albumin: 4.1 g/dL (ref 3.5–5.0)
Alkaline Phosphatase: 74 U/L (ref 38–126)
Anion gap: 8 (ref 5–15)
BUN: 18 mg/dL (ref 6–20)
CALCIUM: 8.9 mg/dL (ref 8.9–10.3)
CO2: 25 mmol/L (ref 22–32)
Chloride: 106 mmol/L (ref 101–111)
Creatinine, Ser: 1.15 mg/dL (ref 0.61–1.24)
GLUCOSE: 104 mg/dL — AB (ref 65–99)
Potassium: 3.7 mmol/L (ref 3.5–5.1)
Sodium: 139 mmol/L (ref 135–145)
Total Bilirubin: 0.5 mg/dL (ref 0.3–1.2)
Total Protein: 7.5 g/dL (ref 6.5–8.1)

## 2014-12-11 LAB — ABO/RH: ABO/RH(D): A POS

## 2014-12-11 MED ORDER — DICYCLOMINE HCL 20 MG PO TABS: 20.0000 mg | ORAL_TABLET | Freq: Two times a day (BID) | ORAL | Status: AC

## 2014-12-11 MED ORDER — PREDNISONE 20 MG PO TABS: 40.0000 mg | ORAL_TABLET | Freq: Every day | ORAL | Status: AC

## 2014-12-11 MED ORDER — DICYCLOMINE HCL 20 MG PO TABS
10.0000 mg | ORAL_TABLET | Freq: Once | ORAL | Status: AC
  Administered 2014-12-11: 10 mg via ORAL
  Filled 2014-12-11: qty 1

## 2014-12-11 NOTE — Discharge Instructions (Signed)
Ulcerative Colitis  Ulcerative colitis is a long lasting swelling and soreness (inflammation) of the colon (large intestine). In patients with ulcerative colitis, sores (ulcers) and inflammation of the inner lining of the colon lead to illness. Ulcerative colitis can also cause problems outside the digestive tract.   Ulcerative colitis is closely related to another condition of inflammation of the intestines called Crohn's disease. Together, they are frequently referred to as inflammatory bowel disease (IBD). Ulcerative colitis and Crohn's diseases are conditions that can last years to decades. Men and women are affected equally. They most commonly begin during adolescence and early adulthood.  SYMPTOMS   Common symptoms of ulcerative colitis include rectal bleeding and diarrhea. There is a wide range of symptoms among patients with this disease depending on how severe the disease is. Some of these symptoms are:  · Abdominal pain or cramping.  · Diarrhea.  · Fever.  · Tiredness (fatigue).  · Weight loss.  · Night sweats.  · Rectal pain.  · Feeling the immediate need to have a bowel movement (rectal urgency).  CAUSES   Ulcerative colitis is caused by increased activity of the immune system in the intestines. The immune system is the system that protects the body against disease such as harmful bacteria, viruses, fungi, and other foreign invaders. When the immune system overacts, it causes inflammation. The cause of the increased immune system activity is not known. This over activity causes long-lasting inflammation and ulceration. This condition may be passed down from your parents (inherited). Brothers, sisters, children, and parents of patients with IBD are more likely to develop these diseases. It is not contagious. This means you cannot catch it from someone else.  DIAGNOSIS   Your caregiver may suspect ulcerative colitis based on your symptoms and exam. Blood tests may confirm that there is a problem. You may  be asked to submit a stool specimen for examination. X-rays and CT scans may be necessary. Ultimately, the diagnosis is usually made after a flexible tube is inserted via your anus and your colon is examined under sedation (colonoscopy). With this test, the specialist can take a tiny tissue sample from inside the bowel (biopsy). Examination of this biopsy tissue under a microscopy can reveal ulcerative colitis as the cause of your symptoms.  TREATMENT   · There is no cure for ulcerative colitis.  · Complications such as massive bleeding from the colon (hemorrhage), development of a hole in the colon (perforation), or the development of precancerous or cancerous changes of the colon may require surgery.  · Medications are often used to decrease inflammation and control the immune system. These include medicines related to aspirin, steroid medications, and newer and stronger medications to slow down the immune system. Some medications may be used as suppositories or enemas. A number of other medications are used or have been studied. Your caregiver will make specific recommendations.  HOME CARE INSTRUCTIONS   · There is no cure for ulcerative colitis disease. The best treatment is frequent checkups with your caregiver. Periodic reevaluation is important.  · Symptoms such as diarrhea can be controlled with medications. Avoid foods that have a laxative effect such fresh fruit and vegetables and dairy products. During flare ups, you can rest your bowel by staying away from solid foods. Drink clear liquids frequently during the day. Electrolyte or rehydrating fluids are best. Your caregiver can help you with suggestions. Drink often to prevent dehydration. When diarrhea has cleared, eat smaller meals and more often. Avoid food additives   and stimulants such as caffeine (coffee, tea, many sodas, or chocolate). Avoid dairy products. Enzyme supplements may help if you develop intolerance to a sugar in dairy products  (lactose). Ask your caregiver or dietitian about specific dietary instructions.  · If you had surgery, be sure you understand your care instructions thoroughly, including proper care of any surgical wounds.  · Take any medications exactly as prescribed.  · Try to maintain a positive attitude. Learn relaxation techniques such as self hypnosis, mental imaging, and muscle relaxation. If possible, avoid stresses that aggravate your condition. Exercise regularly. Follow your diet. Always get plenty of rest.  SEEK MEDICAL CARE IF:   · Your symptoms fail to improve after a week or two of new treatment.  · You experience continued weight loss.  · You have ongoing crampy digestion or loose bowels.  · You develop a new skin rash, skin sores, or eye problems.  SEEK IMMEDIATE MEDICAL CARE IF:   · You have worsening of your symptoms or develop new symptoms.  · You have an oral temperature above 102° F (38.9° C), not controlled by medicine.  · You develop bloody diarrhea.  · You have severe abdominal pain.  Document Released: 03/16/2005 Document Revised: 08/29/2011 Document Reviewed: 02/13/2007  [employer]® Patient Information ©2015 [employer]. This information is not intended to replace advice given to you by your health care provider. Make sure you discuss any questions you have with your health care provider.

## 2014-12-11 NOTE — ED Notes (Signed)
Pt ambulated without difficulty or dizziness. Stand by assist while ambulating. Pt states he feels fine with walking.

## 2014-12-11 NOTE — ED Notes (Signed)
Pt states he has tunnel vision when he stands up. He had a dizzy episode and blood pressures were taken from sitting to standing. He lost footing and son was behind him to help him gain footing.

## 2015-07-04 IMAGING — RF DG CERVICAL SPINE 1V
1 series · 1 of 1 positions shown · non-contrast
Comparison: None.

CLINICAL DATA: Anterior surgical fusion.

EXAM:
DG C-ARM 61-120 MIN; DG CERVICAL SPINE - 1 VIEW

[Series 1: run · 1 of 1 slices shown]
[im 1/1]
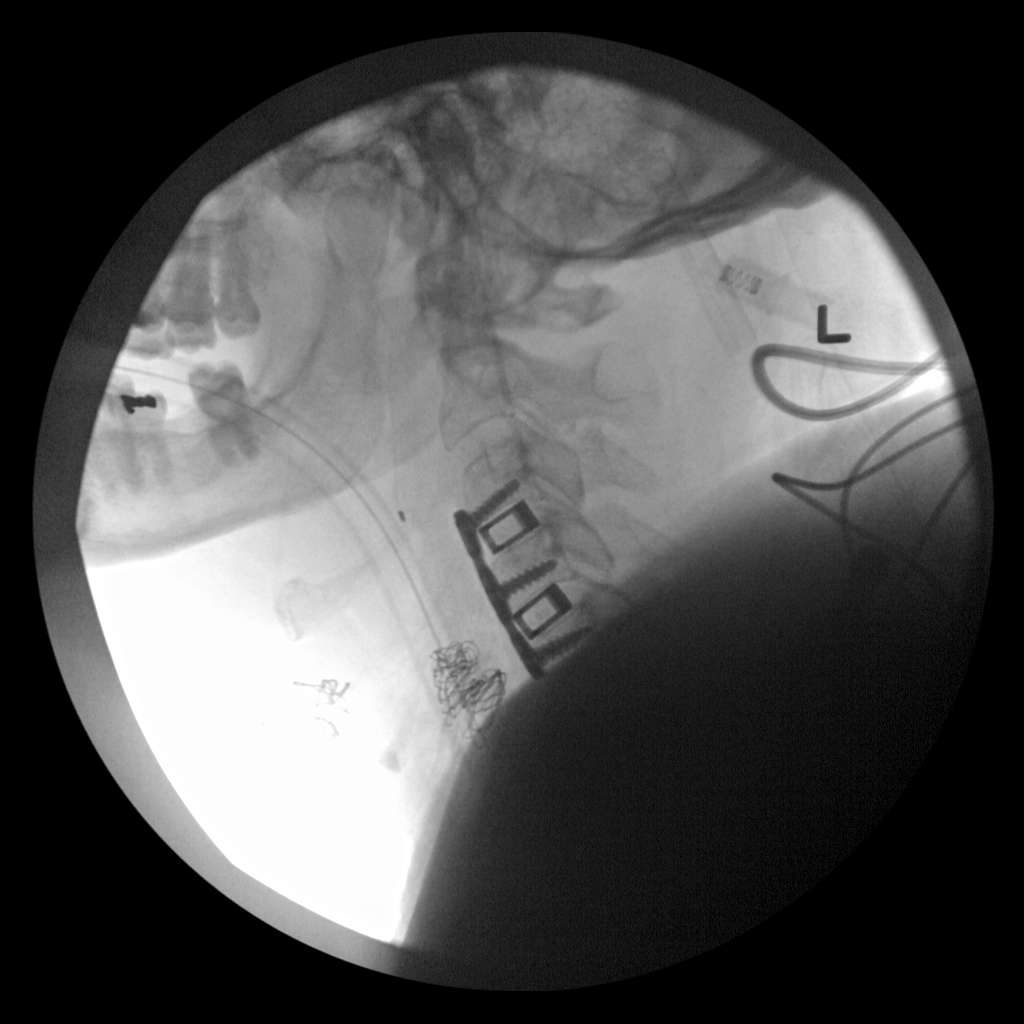

[1 of 1 positions shown; findings below may reference images not displayed]

FINDINGS: Single lateral intraoperative fluoroscopic image of the cervical
spine demonstrates the patient to be status post anterior cervical
fusion of C3, C4 and C5 with interbody fusion. Good alignment of
vertebral bodies is noted.
IMPRESSION: Status post surgical anterior fusion of C3, C4-C5.
